# Patient Record
Sex: Male | Born: 1950 | Race: White | Hispanic: No | Marital: Married | Smoking: Never smoker
Health system: Southern US, Community
[De-identification: ages and names within clinical notes are randomized; demographics above are authoritative.]

## PROBLEM LIST (undated history)

## (undated) DIAGNOSIS — K219 Gastro-esophageal reflux disease without esophagitis: Secondary | ICD-10-CM

## (undated) DIAGNOSIS — I10 Essential (primary) hypertension: Secondary | ICD-10-CM

## (undated) DIAGNOSIS — F419 Anxiety disorder, unspecified: Secondary | ICD-10-CM

## (undated) DIAGNOSIS — E119 Type 2 diabetes mellitus without complications: Secondary | ICD-10-CM

## (undated) DIAGNOSIS — N3281 Overactive bladder: Secondary | ICD-10-CM

## (undated) DIAGNOSIS — F32A Depression, unspecified: Secondary | ICD-10-CM

## (undated) DIAGNOSIS — E785 Hyperlipidemia, unspecified: Secondary | ICD-10-CM

## (undated) DIAGNOSIS — M792 Neuralgia and neuritis, unspecified: Secondary | ICD-10-CM

## (undated) DIAGNOSIS — F329 Major depressive disorder, single episode, unspecified: Secondary | ICD-10-CM

## (undated) DIAGNOSIS — D649 Anemia, unspecified: Secondary | ICD-10-CM

## (undated) DIAGNOSIS — Z944 Liver transplant status: Secondary | ICD-10-CM

## (undated) DIAGNOSIS — M1990 Unspecified osteoarthritis, unspecified site: Secondary | ICD-10-CM

## (undated) HISTORY — DX: Anemia, unspecified: D64.9

## (undated) HISTORY — PX: TONSILLECTOMY: SUR1361

## (undated) HISTORY — DX: Unspecified osteoarthritis, unspecified site: M19.90

## (undated) HISTORY — DX: Hyperlipidemia, unspecified: E78.5

## (undated) HISTORY — PX: LIVER TRANSPLANT: SHX410

## (undated) HISTORY — PX: CHOLECYSTECTOMY: SHX55

---

## 1980-07-06 DIAGNOSIS — Z8619 Personal history of other infectious and parasitic diseases: Secondary | ICD-10-CM

## 1980-07-06 HISTORY — DX: Personal history of other infectious and parasitic diseases: Z86.19

## 1995-07-07 HISTORY — PX: TOTAL HIP ARTHROPLASTY: SHX124

## 2004-07-06 DIAGNOSIS — E119 Type 2 diabetes mellitus without complications: Secondary | ICD-10-CM

## 2004-07-06 HISTORY — DX: Type 2 diabetes mellitus without complications: E11.9

## 2018-02-27 ENCOUNTER — Encounter (HOSPITAL_COMMUNITY): Payer: Self-pay | Admitting: *Deleted

## 2018-02-27 ENCOUNTER — Emergency Department (HOSPITAL_COMMUNITY): Payer: Medicare HMO

## 2018-02-27 ENCOUNTER — Other Ambulatory Visit: Payer: Self-pay

## 2018-02-27 ENCOUNTER — Emergency Department (HOSPITAL_COMMUNITY)
Admission: EM | Admit: 2018-02-27 | Discharge: 2018-02-28 | Disposition: A | Discharge status: Home / Self Care | Payer: Medicare HMO | Source: Home / Self Care | Attending: Emergency Medicine | Admitting: Emergency Medicine

## 2018-02-27 DIAGNOSIS — I10 Essential (primary) hypertension: Secondary | ICD-10-CM

## 2018-02-27 DIAGNOSIS — Z7982 Long term (current) use of aspirin: Secondary | ICD-10-CM | POA: Diagnosis not present

## 2018-02-27 DIAGNOSIS — Z944 Liver transplant status: Secondary | ICD-10-CM | POA: Insufficient documentation

## 2018-02-27 DIAGNOSIS — Z794 Long term (current) use of insulin: Secondary | ICD-10-CM | POA: Insufficient documentation

## 2018-02-27 DIAGNOSIS — E785 Hyperlipidemia, unspecified: Secondary | ICD-10-CM | POA: Diagnosis not present

## 2018-02-27 DIAGNOSIS — N3281 Overactive bladder: Secondary | ICD-10-CM | POA: Diagnosis not present

## 2018-02-27 DIAGNOSIS — A419 Sepsis, unspecified organism: Secondary | ICD-10-CM | POA: Diagnosis not present

## 2018-02-27 DIAGNOSIS — M7989 Other specified soft tissue disorders: Secondary | ICD-10-CM | POA: Diagnosis not present

## 2018-02-27 DIAGNOSIS — E119 Type 2 diabetes mellitus without complications: Secondary | ICD-10-CM | POA: Diagnosis not present

## 2018-02-27 DIAGNOSIS — F329 Major depressive disorder, single episode, unspecified: Secondary | ICD-10-CM | POA: Diagnosis not present

## 2018-02-27 DIAGNOSIS — N390 Urinary tract infection, site not specified: Secondary | ICD-10-CM | POA: Insufficient documentation

## 2018-02-27 DIAGNOSIS — F419 Anxiety disorder, unspecified: Secondary | ICD-10-CM | POA: Diagnosis not present

## 2018-02-27 DIAGNOSIS — Z96649 Presence of unspecified artificial hip joint: Secondary | ICD-10-CM

## 2018-02-27 DIAGNOSIS — R509 Fever, unspecified: Secondary | ICD-10-CM | POA: Diagnosis present

## 2018-02-27 DIAGNOSIS — K219 Gastro-esophageal reflux disease without esophagitis: Secondary | ICD-10-CM | POA: Diagnosis not present

## 2018-02-27 DIAGNOSIS — D696 Thrombocytopenia, unspecified: Secondary | ICD-10-CM | POA: Diagnosis not present

## 2018-02-27 DIAGNOSIS — R600 Localized edema: Secondary | ICD-10-CM | POA: Diagnosis not present

## 2018-02-27 DIAGNOSIS — R2243 Localized swelling, mass and lump, lower limb, bilateral: Secondary | ICD-10-CM | POA: Insufficient documentation

## 2018-02-27 DIAGNOSIS — G9341 Metabolic encephalopathy: Secondary | ICD-10-CM | POA: Diagnosis not present

## 2018-02-27 DIAGNOSIS — M79675 Pain in left toe(s): Secondary | ICD-10-CM

## 2018-02-27 DIAGNOSIS — R51 Headache: Secondary | ICD-10-CM

## 2018-02-27 HISTORY — DX: Type 2 diabetes mellitus without complications: E11.9

## 2018-02-27 HISTORY — DX: Liver transplant status: Z94.4

## 2018-02-27 HISTORY — DX: Anxiety disorder, unspecified: F41.9

## 2018-02-27 HISTORY — DX: Neuralgia and neuritis, unspecified: M79.2

## 2018-02-27 HISTORY — DX: Major depressive disorder, single episode, unspecified: F32.9

## 2018-02-27 HISTORY — DX: Overactive bladder: N32.81

## 2018-02-27 HISTORY — DX: Gastro-esophageal reflux disease without esophagitis: K21.9

## 2018-02-27 HISTORY — DX: Depression, unspecified: F32.A

## 2018-02-27 HISTORY — DX: Essential (primary) hypertension: I10

## 2018-02-27 LAB — CBC WITH DIFFERENTIAL/PLATELET
BASOS ABS: 0 10*3/uL (ref 0.0–0.1)
BASOS PCT: 0 %
Eosinophils Absolute: 0 10*3/uL (ref 0.0–0.7)
Eosinophils Relative: 0 %
HEMATOCRIT: 36.4 % — AB (ref 39.0–52.0)
HEMOGLOBIN: 12.1 g/dL — AB (ref 13.0–17.0)
Lymphocytes Relative: 10 %
Lymphs Abs: 0.8 10*3/uL (ref 0.7–4.0)
MCH: 29.3 pg (ref 26.0–34.0)
MCHC: 33.2 g/dL (ref 30.0–36.0)
MCV: 88.1 fL (ref 78.0–100.0)
Monocytes Absolute: 0.6 10*3/uL (ref 0.1–1.0)
Monocytes Relative: 7 %
NEUTROS ABS: 6.5 10*3/uL (ref 1.7–7.7)
NEUTROS PCT: 83 %
Platelets: 79 10*3/uL — ABNORMAL LOW (ref 150–400)
RBC: 4.13 MIL/uL — AB (ref 4.22–5.81)
RDW: 14 % (ref 11.5–15.5)
WBC: 7.9 10*3/uL (ref 4.0–10.5)

## 2018-02-27 LAB — URINALYSIS, ROUTINE W REFLEX MICROSCOPIC
BACTERIA UA: NONE SEEN
Bilirubin Urine: NEGATIVE
Glucose, UA: NEGATIVE mg/dL
KETONES UR: NEGATIVE mg/dL
Nitrite: NEGATIVE
Protein, ur: NEGATIVE mg/dL
Specific Gravity, Urine: 1.013 (ref 1.005–1.030)
pH: 5 (ref 5.0–8.0)

## 2018-02-27 LAB — I-STAT CG4 LACTIC ACID, ED: Lactic Acid, Venous: 0.62 mmol/L (ref 0.5–1.9)

## 2018-02-27 LAB — COMPREHENSIVE METABOLIC PANEL
ALBUMIN: 3.8 g/dL (ref 3.5–5.0)
ALK PHOS: 68 U/L (ref 38–126)
ALT: 33 U/L (ref 0–44)
ANION GAP: 9 (ref 5–15)
AST: 28 U/L (ref 15–41)
BUN: 24 mg/dL — AB (ref 8–23)
CO2: 24 mmol/L (ref 22–32)
CREATININE: 1.39 mg/dL — AB (ref 0.61–1.24)
Calcium: 9.1 mg/dL (ref 8.9–10.3)
Chloride: 108 mmol/L (ref 98–111)
GFR calc Af Amer: >60 mL/min (ref >60)
GFR calc non Af Amer: 52 mL/min — ABNORMAL LOW (ref >60)
GLUCOSE: 137 mg/dL — AB (ref 70–99)
Potassium: 4.2 mmol/L (ref 3.5–5.1)
SODIUM: 141 mmol/L (ref 135–145)
Total Bilirubin: 0.7 mg/dL (ref 0.3–1.2)
Total Protein: 6.4 g/dL — ABNORMAL LOW (ref 6.5–8.1)

## 2018-02-27 MED ORDER — CEPHALEXIN 500 MG PO CAPS: 500.0000 mg | ORAL_CAPSULE | Freq: Four times a day (QID) | ORAL | 0 refills | Status: AC

## 2018-02-27 MED ORDER — SODIUM CHLORIDE 0.9 % IV SOLN
1.0000 g | Freq: Once | INTRAVENOUS | Status: AC
  Administered 2018-02-27: 1 g via INTRAVENOUS
  Filled 2018-02-27: qty 10

## 2018-02-27 MED ORDER — HYDROMORPHONE HCL 1 MG/ML IJ SOLN
0.5000 mg | Freq: Once | INTRAMUSCULAR | Status: AC
  Administered 2018-02-27: 0.5 mg via INTRAVENOUS
  Filled 2018-02-27: qty 1

## 2018-02-27 MED ORDER — ACETAMINOPHEN 325 MG PO TABS
650.0000 mg | ORAL_TABLET | Freq: Once | ORAL | Status: AC
  Administered 2018-02-27: 650 mg via ORAL
  Filled 2018-02-27: qty 2

## 2018-02-27 MED ORDER — INSULIN GLARGINE 100 UNIT/ML ~~LOC~~ SOLN: 40.0000 [IU] | Freq: Every day | SUBCUTANEOUS | 11 refills | Status: AC

## 2018-02-27 MED ORDER — INSULIN GLARGINE 100 UNIT/ML ~~LOC~~ SOLN
40.0000 [IU] | Freq: Once | SUBCUTANEOUS | Status: AC
  Administered 2018-02-28: 40 [IU] via SUBCUTANEOUS
  Filled 2018-02-27: qty 0.4

## 2018-02-27 MED ORDER — SODIUM CHLORIDE 0.9 % IV BOLUS
500.0000 mL | Freq: Once | INTRAVENOUS | Status: AC
  Administered 2018-02-27: 500 mL via INTRAVENOUS

## 2018-02-27 NOTE — ED Notes (Signed)
Bed: WA01 Expected date:  Expected time:  Means of arrival:  Comments: 67 yo M/poss sepsis

## 2018-02-27 NOTE — ED Triage Notes (Signed)
Pt bib EMS and coming from home.  Pt has been feeling like he's been having a fever x 1 week.  Family stated to EMS that pt is a little altered in his mental status.  Pt is unsure of the year and is slow to respond.  Pt feels warm to the touch and has swelling to bilateral lower legs.  Pt his tachypnec and tachycardiac. Pt recently moved from [place] and hasn't had all of his medication reconciled.  Hx of liver transplant x 10 years.  Pt has been able to get and take his transplant medications.  Pt think he has a UTI from self-catherizing.  EMS VS:  CBG: 153, Temp: 105.1, HR: 110, BP: 132/84, RR: 30

## 2018-02-27 NOTE — ED Provider Notes (Signed)
H/o liver transplant x 10 years New to [place] - not established Fever x 1 week, Tmax 100.7, abdominal pain and left flank pain H/o self caths - UA no bacteria Labs pending HA since arrival LE edema, weeks to months, left > right In process of getting meds here from [place] - out for one week  Tylenol for HA given Has been seen by Wilson Singer  Patient does not want to be admitted. Plan is IV dilaudid, recheck headache for improvement. If better, can discharge per plan of Khatri/Kohut.  On recheck, the patient reports his headache is resolved and he is ready for discharge home per plan of previous treatment team.    Charlann Lange, PA-C 02/28/18 2458    Virgel Manifold, MD 03/03/18 1445

## 2018-02-27 NOTE — Discharge Instructions (Addendum)
Please complete the entire course of your antibiotics regardless of symptom improvement to prevent worsening or recurrence of your infection. You will need to return in the morning to complete an ultrasound of your left leg to rule out a DVT. Take Lantus as was previously prescribed. Return to ED for worsening symptoms, worsening headache or abdominal pain, ongoing fever not responding to medications, vomiting or coughing up blood.

## 2018-02-27 NOTE — ED Provider Notes (Signed)
[facility] DEPT Provider Note   CSN: [id number] Arrival date & time: 02/27/18  2023     History   Chief Complaint Chief Complaint  Patient presents with  . Fever    HPI Jonathan Perkins is a 67 y.o. male with a past medical history of HTN, diabetes, s/p liver transplant x2 due to hepatitis C (10 years ago at [facility]), who presents to ED for evaluation of multiple complaints. Patient moved to the area last month and has not yet established care here. He complains of fever for the past week. He self-caths three times daily as directed by his urologist in [place]. He believes he has a UTI from his self cathing.  He reports left-sided flank pain and discomfort.  He reports T-max of 100 point 31F today.  He lacks took an antipyretic anywhere from 6 to 7 hours ago.  He denies any bowel changes, back pain, penile discharge. His next complaint is lower extremity edema.  He states that the symptoms have been going on for several months.  He was seen and evaluated by his PCP but stated that "they are concerned about it and did not do anything about it."  He reports left-sided great toe pain for the past several days that is improved.  Denies any history of gout.  Denies any injury or trauma to the area.  He denies history of DVT, recent surgeries, recent prolonged travel. His next complaint is headache.  States he has had a headache since arriving in the ED.  Denies any neck pain, head injuries or falls, numbness in arms or legs. Patient has not been able to get his medications physically refilled here in [place] from [place].  He last took 13 units of insulin last night.  He has not taken any insulin today.  HPI  Past Medical History:  Diagnosis Date  . Acid reflux   . Anxiety   . Depression   . Diabetes mellitus without complication ([place])   . Hypertension   . Liver transplant recipient [place])   . Nerve pain   . Overactive bladder     There are no active  problems to display for this patient.   Past Surgical History:  Procedure Laterality Date  . CHOLECYSTECTOMY    . LIVER TRANSPLANT     2006, 2009  . TONSILLECTOMY    . TOTAL HIP ARTHROPLASTY  1997        Home Medications    Prior to Admission medications   Medication Sig Start Date End Date Taking? Authorizing Provider  cephALEXin (KEFLEX) 500 MG capsule Take 1 capsule (500 mg total) by mouth 4 (four) times daily for 7 days. 02/27/18 03/06/18  Yamel Bale, PA-C  insulin glargine (LANTUS) 100 UNIT/ML injection Inject 0.4 mLs (40 Units total) into the skin at bedtime. 02/27/18   Delia Heady, PA-C    Family History No family history on file.  Social History Social History   Tobacco Use  . Smoking status: Never Smoker  . Smokeless tobacco: Never Used  Substance Use Topics  . Alcohol use: Never    Frequency: Never  . Drug use: Never     Allergies   Patient has no known allergies.   Review of Systems Review of Systems  Constitutional: Positive for chills and fever. Negative for appetite change.  HENT: Negative for ear pain, rhinorrhea, sneezing and sore throat.   Eyes: Negative for photophobia and visual disturbance.  Respiratory: Negative for cough, chest tightness, shortness of  breath and wheezing.   Cardiovascular: Positive for leg swelling. Negative for chest pain and palpitations.  Gastrointestinal: Positive for abdominal pain. Negative for blood in stool, constipation, diarrhea, nausea and vomiting.  Genitourinary: Positive for dysuria and flank pain. Negative for hematuria and urgency.  Musculoskeletal: Negative for myalgias.  Skin: Negative for rash.  Allergic/Immunologic: Positive for immunocompromised state.  Neurological: Positive for headaches. Negative for dizziness, weakness and light-headedness.     Physical Exam Updated Vital Signs BP 126/88 (BP Location: Right Arm)   Pulse (!) 116   Temp 99.4 F (37.4 C) (Oral)   Resp (!) 21   Ht 6\' 2"  (1.88  m)   Wt 120.2 kg   SpO2 97%   BMI 34.02 kg/m   Physical Exam  Constitutional: He is oriented to person, place, and time. He appears well-developed and well-nourished. No distress.  Nontoxic appearing and in no acute distress.  HENT:  Head: Normocephalic and atraumatic.  Nose: Nose normal.  Eyes: Pupils are equal, round, and reactive to light. Conjunctivae and EOM are normal. Right eye exhibits no discharge. Left eye exhibits no discharge. No scleral icterus.  Neck: Normal range of motion. Neck supple.  No meningismus.  Cardiovascular: Regular rhythm, normal heart sounds and intact distal pulses. Tachycardia present. Exam reveals no gallop and no friction rub.  No murmur heard. Pulmonary/Chest: Effort normal and breath sounds normal. No respiratory distress.  Abdominal: Soft. Bowel sounds are normal. He exhibits no distension. There is tenderness (Generalized). There is no guarding.  No CVA tenderness bilaterally.  Musculoskeletal: Normal range of motion. He exhibits no edema.  Trace pitting edema in bilateral lower extremities.  Dorsum of left foot appears edematous. L > R.  No calf tenderness or overlying erythema bilaterally.  Mild erythema of the left great toe with no tenderness to palpation.  Neurological: He is alert and oriented to person, place, and time. No cranial nerve deficit or sensory deficit. He exhibits normal muscle tone. Coordination normal.  Pupils reactive. No facial asymmetry noted. Cranial nerves appear grossly intact. Sensation intact to light touch on face, BUE and BLE. Strength 5/5 in BUE and BLE.   Skin: Skin is warm and dry. No rash noted.  Psychiatric: He has a normal mood and affect.  Nursing note and vitals reviewed.    ED Treatments / Results  Labs (all labs ordered are listed, but only abnormal results are displayed) Labs Reviewed  URINALYSIS, ROUTINE W REFLEX MICROSCOPIC - Abnormal; Notable for the following components:      Result Value   Hgb  urine dipstick MODERATE (*)    Leukocytes, UA TRACE (*)    All other components within normal limits  CBC WITH DIFFERENTIAL/PLATELET - Abnormal; Notable for the following components:   RBC 4.13 (*)    Hemoglobin 12.1 (*)    HCT 36.4 (*)    Platelets 79 (*)    All other components within normal limits  COMPREHENSIVE METABOLIC PANEL - Abnormal; Notable for the following components:   Glucose, Bld 137 (*)    BUN 24 (*)    Creatinine, Ser 1.39 (*)    Total Protein 6.4 (*)    GFR calc non Af Amer 52 (*)    All other components within normal limits  URINE CULTURE  CULTURE, BLOOD (ROUTINE X 2)  CULTURE, BLOOD (ROUTINE X 2)  I-STAT CG4 LACTIC ACID, ED    EKG None  Radiology Dg Chest 2 View  Result Date: 02/27/2018 CLINICAL DATA:  Fever EXAM:  CHEST - 2 VIEW COMPARISON:  None. FINDINGS: Right base atelectasis. Left lung clear. Heart is normal size. No effusions or acute bony abnormality. IMPRESSION: Right base atelectasis. Electronically Signed   By: Rolm Baptise M.D.   On: 02/27/2018 23:01    Procedures Procedures (including critical care time)  Medications Ordered in ED Medications  sodium chloride 0.9 % bolus 500 mL (500 mLs Intravenous New Bag/Given 02/27/18 2253)  HYDROmorphone (DILAUDID) injection 0.5 mg (has no administration in time range)  insulin glargine (LANTUS) injection 40 Units (has no administration in time range)  cefTRIAXone (ROCEPHIN) 1 g in sodium chloride 0.9 % 100 mL IVPB (0 g Intravenous Stopped 02/27/18 2243)  acetaminophen (TYLENOL) tablet 650 mg (650 mg Oral Given 02/27/18 2217)     Initial Impression / Assessment and Plan / ED Course  I have reviewed the triage vital signs and the nursing notes.  Pertinent labs & imaging results that were available during my care of the patient were reviewed by me and considered in my medical decision making (see chart for details).  Clinical Course as of Feb 28 2335  Nancy Fetter Feb 27, 2018  2223 Additional history obtained  from patient.  He has been self cathing 3 times a day for the past month.  He was told by his urologist that he had a "overactive bladder."  He received Botox injections 4 weeks ago but states that "they gave me too much Botox my bladder was too relaxed."  Since then he has had to self cath.   [HK]  2315 Patient reports he has been taking his HTN and other medications. He just ran out of his Lantus last night.   [HK]    Clinical Course User Index [HK] Delia Heady, PA-C    67 year old male with a past medical history of hypertension, diabetes, status post liver transplant x2 (10 years ago at [facility]) who recently moved to this area and has not yet established with a primary care provider here presents for multiple complaints. 1) fever and possible UTI: Patient reports that he needs to self cath his bladder 3 times a day due to his overactive bladder.  He reports T-max of 100.7.  Last took antipyretics 6 hours ago.  Denies any bowel changes.  He reports left-sided flank pain and discomfort.  Tenderness palpation of the general abdomen and left flank noted on exam.  No rebound or guarding noted.  Febrile to 100.5 on arrival here.  Urine sent for culture but did show trace leukocytes, some WBCs but no bacteria.  Patient given Rocephin. CXR with no acute abnormalities. Lactic acid normal at 0.62. CBC with normal WBC count. CMP shows slight elevation in Cr to 1.3, Bun to 24.  No prior labs for comparison. 2) lower extremity edema L > R: States this is going on for several weeks to months.  He reports his PCP has evaluated him about the matter but is not concerned about it.  He has never been evaluated for a DVT.  Denies history of DVT, recent surgeries, recent prolonged travel. Trace pitting edema noted on exam bilaterally. Edema of the dorsum of the left foot noted. Findings suspicious for gout noted on left great toe although he reports pain has improved over the past several days. He will need an U/S to r/o  DVT.  3) headache: appears to be a tension headache. He reports since arrival in the ED he's had the headache. No deficits in neurological exam noted. PERRL. No meningismus noted.  Most likely 2/2 fever.  Patient given Rocephin here.  Fever improved.  Will give pain medication for headache and reassess.  Patient is requesting discharge home which I feel is reasonable based on his unremarkable lab work and reassuring vital signs now.  He remains tachycardic.  Will discharge home with ultrasound to rule out DVT of left leg in the a.m., remainder of antibiotic course and refill of insulin.  Patient given 40 units of insulin which is his nightly dose here prior to discharge. Care handed off to S. Upstill, PA-C pending recheck after medication administered.  Patient discussed with and seen by my attending, Dr. Wilson Singer.  Final Clinical Impressions(s) / ED Diagnoses   Final diagnoses:  Lower urinary tract infectious disease    ED Discharge Orders         Ordered    LE VENOUS     02/27/18 2335    cephALEXin (KEFLEX) 500 MG capsule  4 times daily     02/27/18 2335    insulin glargine (LANTUS) 100 UNIT/ML injection  Daily at bedtime     02/27/18 2335           Delia Heady, PA-C 02/27/18 2339    Virgel Manifold, MD 03/03/18 1445

## 2018-02-27 NOTE — ED Notes (Signed)
Dr. Kohut at bedside 

## 2018-02-28 ENCOUNTER — Emergency Department (HOSPITAL_COMMUNITY): Admit: 2018-02-28 | Payer: Medicare HMO

## 2018-02-28 ENCOUNTER — Observation Stay (HOSPITAL_COMMUNITY): Payer: Medicare HMO

## 2018-02-28 ENCOUNTER — Emergency Department (HOSPITAL_BASED_OUTPATIENT_CLINIC_OR_DEPARTMENT_OTHER): Payer: Medicare HMO

## 2018-02-28 ENCOUNTER — Other Ambulatory Visit: Payer: Self-pay

## 2018-02-28 ENCOUNTER — Observation Stay (HOSPITAL_COMMUNITY)
Admission: EM | Admit: 2018-02-28 | Discharge: 2018-03-01 | Disposition: A | Discharge status: Other Acute Inpatient Hospital | Payer: Medicare HMO | Attending: Internal Medicine | Admitting: Internal Medicine

## 2018-02-28 ENCOUNTER — Emergency Department (HOSPITAL_COMMUNITY): Payer: Medicare HMO

## 2018-02-28 ENCOUNTER — Encounter (HOSPITAL_COMMUNITY): Payer: Self-pay | Admitting: Internal Medicine

## 2018-02-28 DIAGNOSIS — R509 Fever, unspecified: Secondary | ICD-10-CM

## 2018-02-28 DIAGNOSIS — R609 Edema, unspecified: Secondary | ICD-10-CM | POA: Diagnosis not present

## 2018-02-28 DIAGNOSIS — F419 Anxiety disorder, unspecified: Secondary | ICD-10-CM | POA: Insufficient documentation

## 2018-02-28 DIAGNOSIS — D696 Thrombocytopenia, unspecified: Secondary | ICD-10-CM | POA: Diagnosis present

## 2018-02-28 DIAGNOSIS — N3281 Overactive bladder: Secondary | ICD-10-CM | POA: Insufficient documentation

## 2018-02-28 DIAGNOSIS — E119 Type 2 diabetes mellitus without complications: Secondary | ICD-10-CM | POA: Diagnosis not present

## 2018-02-28 DIAGNOSIS — N3000 Acute cystitis without hematuria: Secondary | ICD-10-CM

## 2018-02-28 DIAGNOSIS — A419 Sepsis, unspecified organism: Secondary | ICD-10-CM | POA: Diagnosis not present

## 2018-02-28 DIAGNOSIS — G9341 Metabolic encephalopathy: Secondary | ICD-10-CM | POA: Diagnosis not present

## 2018-02-28 DIAGNOSIS — Z944 Liver transplant status: Secondary | ICD-10-CM

## 2018-02-28 DIAGNOSIS — N390 Urinary tract infection, site not specified: Secondary | ICD-10-CM | POA: Diagnosis present

## 2018-02-28 DIAGNOSIS — R600 Localized edema: Secondary | ICD-10-CM | POA: Insufficient documentation

## 2018-02-28 DIAGNOSIS — Z794 Long term (current) use of insulin: Secondary | ICD-10-CM | POA: Insufficient documentation

## 2018-02-28 DIAGNOSIS — M7989 Other specified soft tissue disorders: Secondary | ICD-10-CM | POA: Diagnosis present

## 2018-02-28 DIAGNOSIS — Z7982 Long term (current) use of aspirin: Secondary | ICD-10-CM | POA: Insufficient documentation

## 2018-02-28 DIAGNOSIS — E785 Hyperlipidemia, unspecified: Secondary | ICD-10-CM | POA: Insufficient documentation

## 2018-02-28 DIAGNOSIS — F329 Major depressive disorder, single episode, unspecified: Secondary | ICD-10-CM | POA: Insufficient documentation

## 2018-02-28 DIAGNOSIS — I10 Essential (primary) hypertension: Secondary | ICD-10-CM

## 2018-02-28 DIAGNOSIS — K219 Gastro-esophageal reflux disease without esophagitis: Secondary | ICD-10-CM

## 2018-02-28 LAB — COMPREHENSIVE METABOLIC PANEL
ALBUMIN: 3.8 g/dL (ref 3.5–5.0)
ALK PHOS: 63 U/L (ref 38–126)
ALT: 37 U/L (ref 0–44)
ANION GAP: 11 (ref 5–15)
AST: 27 U/L (ref 15–41)
BILIRUBIN TOTAL: 0.7 mg/dL (ref 0.3–1.2)
BUN: 19 mg/dL (ref 8–23)
CALCIUM: 9.2 mg/dL (ref 8.9–10.3)
CO2: 22 mmol/L (ref 22–32)
Chloride: 106 mmol/L (ref 98–111)
Creatinine, Ser: 1.21 mg/dL (ref 0.61–1.24)
GFR calc Af Amer: >60 mL/min (ref >60)
GLUCOSE: 155 mg/dL — AB (ref 70–99)
POTASSIUM: 4.1 mmol/L (ref 3.5–5.1)
Sodium: 139 mmol/L (ref 135–145)
TOTAL PROTEIN: 6.7 g/dL (ref 6.5–8.1)

## 2018-02-28 LAB — CBG MONITORING, ED
GLUCOSE-CAPILLARY: 114 mg/dL — AB (ref 70–99)
Glucose-Capillary: 127 mg/dL — ABNORMAL HIGH (ref 70–99)

## 2018-02-28 LAB — PROTIME-INR
INR: 1.11
Prothrombin Time: 14.2 seconds (ref 11.4–15.2)

## 2018-02-28 LAB — AMMONIA
Ammonia: 20 umol/L (ref 9–35)
Ammonia: 27 umol/L (ref 9–35)

## 2018-02-28 LAB — CBC WITH DIFFERENTIAL/PLATELET
BASOS ABS: 0 10*3/uL (ref 0.0–0.1)
BASOS PCT: 0 %
EOS PCT: 0 %
Eosinophils Absolute: 0 10*3/uL (ref 0.0–0.7)
HEMATOCRIT: 37.3 % — AB (ref 39.0–52.0)
Hemoglobin: 12.3 g/dL — ABNORMAL LOW (ref 13.0–17.0)
Lymphocytes Relative: 8 %
Lymphs Abs: 0.8 10*3/uL (ref 0.7–4.0)
MCH: 29.2 pg (ref 26.0–34.0)
MCHC: 33 g/dL (ref 30.0–36.0)
MCV: 88.6 fL (ref 78.0–100.0)
MONO ABS: 0.7 10*3/uL (ref 0.1–1.0)
Monocytes Relative: 7 %
NEUTROS ABS: 8.6 10*3/uL — AB (ref 1.7–7.7)
Neutrophils Relative %: 85 %
PLATELETS: 76 10*3/uL — AB (ref 150–400)
RBC: 4.21 MIL/uL — AB (ref 4.22–5.81)
RDW: 14.3 % (ref 11.5–15.5)
WBC: 10 10*3/uL (ref 4.0–10.5)

## 2018-02-28 LAB — URINALYSIS, COMPLETE (UACMP) WITH MICROSCOPIC
BILIRUBIN URINE: NEGATIVE
GLUCOSE, UA: NEGATIVE mg/dL
NITRITE: NEGATIVE
SPECIFIC GRAVITY, URINE: 1.02 (ref 1.005–1.030)
pH: 6 (ref 5.0–8.0)

## 2018-02-28 LAB — BRAIN NATRIURETIC PEPTIDE: B NATRIURETIC PEPTIDE 5: 74.3 pg/mL (ref 0.0–100.0)

## 2018-02-28 LAB — LACTATE DEHYDROGENASE: LDH: 118 U/L (ref 98–192)

## 2018-02-28 LAB — LACTIC ACID, PLASMA: Lactic Acid, Venous: 1.4 mmol/L (ref 0.5–1.9)

## 2018-02-28 LAB — PROCALCITONIN: Procalcitonin: 0.25 ng/mL

## 2018-02-28 MED ORDER — HYDROMORPHONE HCL 1 MG/ML IJ SOLN
1.0000 mg | Freq: Once | INTRAMUSCULAR | Status: AC
  Administered 2018-02-28: 1 mg via INTRAVENOUS
  Filled 2018-02-28: qty 1

## 2018-02-28 MED ORDER — HYDROMORPHONE HCL 1 MG/ML IJ SOLN
1.0000 mg | INTRAMUSCULAR | Status: DC | PRN
  Administered 2018-02-28 (×2): 1 mg via INTRAVENOUS
  Filled 2018-02-28 (×2): qty 1

## 2018-02-28 MED ORDER — PREGABALIN 50 MG PO CAPS
50.0000 mg | ORAL_CAPSULE | Freq: Two times a day (BID) | ORAL | Status: DC
  Administered 2018-02-28: 50 mg via ORAL
  Filled 2018-02-28: qty 1

## 2018-02-28 MED ORDER — ONDANSETRON HCL 4 MG/2ML IJ SOLN
4.0000 mg | Freq: Three times a day (TID) | INTRAMUSCULAR | Status: DC | PRN
  Administered 2018-02-28: 4 mg via INTRAVENOUS
  Filled 2018-02-28: qty 2

## 2018-02-28 MED ORDER — ACETAMINOPHEN 325 MG PO TABS
650.0000 mg | ORAL_TABLET | Freq: Four times a day (QID) | ORAL | Status: DC | PRN
  Administered 2018-03-01: 650 mg via ORAL
  Filled 2018-02-28: qty 2

## 2018-02-28 MED ORDER — ONDANSETRON HCL 4 MG/2ML IJ SOLN
4.0000 mg | Freq: Once | INTRAMUSCULAR | Status: AC
  Administered 2018-02-28: 4 mg via INTRAVENOUS
  Filled 2018-02-28: qty 2

## 2018-02-28 MED ORDER — INSULIN ASPART 100 UNIT/ML ~~LOC~~ SOLN: 0.0000 [IU] | Freq: Three times a day (TID) | SUBCUTANEOUS | Status: DC

## 2018-02-28 MED ORDER — SODIUM CHLORIDE 0.9 % IV BOLUS
1000.0000 mL | Freq: Once | INTRAVENOUS | Status: AC
  Administered 2018-02-28: 1000 mL via INTRAVENOUS

## 2018-02-28 MED ORDER — CLONIDINE HCL 0.1 MG PO TABS: 0.2000 mg | ORAL_TABLET | Freq: Two times a day (BID) | ORAL | Status: DC

## 2018-02-28 MED ORDER — SODIUM CHLORIDE 0.9 % IV SOLN
1.0000 g | Freq: Three times a day (TID) | INTRAVENOUS | Status: DC
  Filled 2018-02-28: qty 1

## 2018-02-28 MED ORDER — VANCOMYCIN HCL 10 G IV SOLR: 1750.0000 mg | INTRAVENOUS | Status: DC

## 2018-02-28 MED ORDER — SODIUM CHLORIDE 0.9 % IV SOLN
INTRAVENOUS | Status: DC
  Administered 2018-02-28: 23:00:00 via INTRAVENOUS

## 2018-02-28 MED ORDER — CLONIDINE HCL 0.1 MG PO TABS
0.1000 mg | ORAL_TABLET | Freq: Two times a day (BID) | ORAL | Status: DC
  Administered 2018-02-28: 0.1 mg via ORAL
  Filled 2018-02-28: qty 1

## 2018-02-28 MED ORDER — PANTOPRAZOLE SODIUM 40 MG PO TBEC: 40.0000 mg | DELAYED_RELEASE_TABLET | Freq: Every day | ORAL | Status: DC

## 2018-02-28 MED ORDER — PRAVASTATIN SODIUM 10 MG PO TABS
10.0000 mg | ORAL_TABLET | Freq: Every day | ORAL | Status: DC
  Administered 2018-02-28: 10 mg via ORAL
  Filled 2018-02-28: qty 1

## 2018-02-28 MED ORDER — TAMSULOSIN HCL 0.4 MG PO CAPS
0.4000 mg | ORAL_CAPSULE | Freq: Every day | ORAL | Status: DC
  Administered 2018-02-28: 0.4 mg via ORAL
  Filled 2018-02-28: qty 1

## 2018-02-28 MED ORDER — SENNOSIDES-DOCUSATE SODIUM 8.6-50 MG PO TABS: 1.0000 | ORAL_TABLET | Freq: Every evening | ORAL | Status: DC | PRN

## 2018-02-28 MED ORDER — TRAMADOL HCL 50 MG PO TABS: 50.0000 mg | ORAL_TABLET | Freq: Four times a day (QID) | ORAL | Status: DC | PRN

## 2018-02-28 MED ORDER — PROCHLORPERAZINE EDISYLATE 10 MG/2ML IJ SOLN
10.0000 mg | Freq: Once | INTRAMUSCULAR | Status: AC
  Administered 2018-02-28: 10 mg via INTRAVENOUS
  Filled 2018-02-28: qty 2

## 2018-02-28 MED ORDER — ZOLPIDEM TARTRATE 5 MG PO TABS: 5.0000 mg | ORAL_TABLET | Freq: Every evening | ORAL | Status: DC | PRN

## 2018-02-28 MED ORDER — ACETAMINOPHEN 650 MG RE SUPP: 650.0000 mg | Freq: Four times a day (QID) | RECTAL | Status: DC | PRN

## 2018-02-28 MED ORDER — DESONIDE 0.05 % EX OINT
1.0000 "application " | TOPICAL_OINTMENT | Freq: Two times a day (BID) | CUTANEOUS | Status: DC
  Filled 2018-02-28: qty 15

## 2018-02-28 MED ORDER — METRONIDAZOLE 0.75 % EX GEL
1.0000 "application " | Freq: Every day | CUTANEOUS | Status: DC | PRN
  Filled 2018-02-28: qty 45

## 2018-02-28 MED ORDER — VANCOMYCIN HCL 10 G IV SOLR
2500.0000 mg | INTRAVENOUS | Status: AC
  Administered 2018-02-28: 2500 mg via INTRAVENOUS
  Filled 2018-02-28: qty 2000

## 2018-02-28 MED ORDER — TRIAMCINOLONE ACETONIDE 0.1 % EX CREA
1.0000 "application " | TOPICAL_CREAM | Freq: Two times a day (BID) | CUTANEOUS | Status: DC
  Filled 2018-02-28: qty 15

## 2018-02-28 MED ORDER — CYCLOSPORINE MODIFIED (GENGRAF) 100 MG PO CAPS
100.0000 mg | ORAL_CAPSULE | Freq: Two times a day (BID) | ORAL | Status: DC
  Administered 2018-02-28: 100 mg via ORAL
  Filled 2018-02-28: qty 1

## 2018-02-28 MED ORDER — HYDRALAZINE HCL 20 MG/ML IJ SOLN: 5.0000 mg | INTRAMUSCULAR | Status: DC | PRN

## 2018-02-28 MED ORDER — SODIUM CHLORIDE 0.9 % IV SOLN
2.0000 g | INTRAVENOUS | Status: AC
  Administered 2018-02-28: 2 g via INTRAVENOUS
  Filled 2018-02-28: qty 2

## 2018-02-28 MED ORDER — METOPROLOL TARTRATE 25 MG PO TABS
50.0000 mg | ORAL_TABLET | Freq: Two times a day (BID) | ORAL | Status: DC
  Administered 2018-02-28: 50 mg via ORAL
  Filled 2018-02-28: qty 2

## 2018-02-28 MED ORDER — ASPIRIN EC 81 MG PO TBEC
81.0000 mg | DELAYED_RELEASE_TABLET | Freq: Every day | ORAL | Status: DC
  Administered 2018-02-28: 81 mg via ORAL
  Filled 2018-02-28: qty 1

## 2018-02-28 MED ORDER — KETOCONAZOLE 2 % EX CREA
1.0000 "application " | TOPICAL_CREAM | Freq: Every day | CUTANEOUS | Status: DC
  Filled 2018-02-28: qty 15

## 2018-02-28 MED ORDER — INSULIN GLARGINE 100 UNIT/ML ~~LOC~~ SOLN
28.0000 [IU] | Freq: Every day | SUBCUTANEOUS | Status: DC
  Administered 2018-02-28: 28 [IU] via SUBCUTANEOUS
  Filled 2018-02-28: qty 0.28

## 2018-02-28 NOTE — ED Notes (Signed)
Pt to be transported to [facility] per Dr. Vanita Panda.

## 2018-02-28 NOTE — H&P (Signed)
History and Physical    Jonathan Perkins YIR:[id number] DOB: [birth year] DOA: 02/28/2018  Referring MD/NP/PA:   PCP: Toney Sang, MD   Patient coming from:  The patient is coming from home.  At baseline, pt is independent for most of ADL.   Chief Complaint: Fever, chills, confusion  HPI: Jonathan Perkins is a 67 y.o. male with medical history significant of liver transplantation at [facility] (2006, 2009) on cyclosporine, hypertension, hyperlipidemia, diabetes mellitus, GERD, depression, self bladder cath, thrombocytopenia, who presents with fever, chills, confusion.  Per patient's partner, patient started having fever, chills, headache, generalized weakness yesterday. Patient does not have chest pain, shortness breath, cough.  He has nausea and vomited once, currently no nausea, vomiting, diarrhea or abdominal pain.  Patient is doing self bladder cath, denies symptoms of UTI.  No facial droop, slurred speech.  Patient has mild runny nose, no sore throat. Pt was seen in ED last night and was thought to have UTI by EDP. Pt was given 1 dose of Rocephin, and discharged on Keflex. His UA showed trace amount of leukocyte, mild pyuria with WBC 11-20, no bacteria with clear appearance last night. Pt also has bilateral leg edema (left is worse than the right), which has been going for several months.  Lower extremity venous Doppler is negative for DVT. Pt sates that he felt better at time of discharge last night, but soon after returning home, his fever came back, with maximal temperature of greater than 100, though different readings in different thermometers. There is some recurrence of lightheadedness, mild confusion, according to the patient's partner. No rashes. No neck rigidity.  Patient can moves his neck freely. Pt states that he was told that he can take tylenol for pain.  # EDP contact [facility] and planned to transfer [facility] to [facility], but after 12 hours, they still did not have bed available (Pals line/Life line  [phone]). We will admit pt tonight temporarily.  ED Course: pt was found to have WBC 10.0, lactic acid of 0.62, creatinine 1.21, GFR 60, temperature 100.5, tachycardia, tachypnea, oxygen saturation 100% on room air. The repeated UA today showed trace amount of leukocyte, clear experience, rare bacteria, WBC 6-10. Chest x-ray negative.  Patient is placed on telemetry bed of observation.  Review of Systems:   General: has fevers, chills, no body weight gain, has fatigue HEENT: no blurry vision, hearing changes or sore throat Respiratory: no dyspnea, coughing, wheezing CV: no chest pain, no palpitations GI: had nausea, vomiting, no abdominal pain, diarrhea, constipation GU: no dysuria, burning on urination, increased urinary frequency, hematuria  Ext: has leg edema Neuro: no unilateral weakness, numbness, or tingling, no vision change or hearing loss. Has HA and confusion. Skin: no rash, no skin tear. MSK: No muscle spasm, no deformity, no limitation of range of movement in spin Heme: No easy bruising.  Travel history: No recent long distant travel.  Allergy:  Allergies  Allergen Reactions  . Nsaids Other (See Comments)    Pt on transplant medications     Past Medical History:  Diagnosis Date  . Acid reflux   . Anxiety   . Depression   . Diabetes mellitus without complication ([place])   . Hypertension   . Liver transplant recipient [facility])   . Nerve pain   . Overactive bladder     Past Surgical History:  Procedure Laterality Date  . CHOLECYSTECTOMY    . LIVER TRANSPLANT     2006, 2009  . TONSILLECTOMY    . TOTAL  HIP ARTHROPLASTY  1997    Social History:  reports that he has never smoked. He has never used smokeless tobacco. He reports that he does not drink alcohol or use drugs.  Family History:  Family History  Problem Relation Age of Onset  . Pulmonary fibrosis Father   . Heart attack Father      Prior to Admission medications   Medication Sig Start Date End  Date Taking? Authorizing Provider  aspirin EC 81 MG tablet Take 81 mg by mouth at bedtime.   Yes [provider]  cephALEXin (KEFLEX) 500 MG capsule Take 1 capsule (500 mg total) by mouth 4 (four) times daily for 7 days. 02/27/18 03/06/18 Yes Khatri, Hina, PA-C  cloNIDine (CATAPRES) 0.2 MG tablet Take 0.2 mg by mouth 2 (two) times daily.   Yes [provider]  cycloSPORINE modified (GENGRAF) 100 MG capsule Take 100 mg by mouth 2 (two) times daily.   Yes [provider]  desonide (DESOWEN) 0.05 % cream Apply 1 application topically 2 (two) times daily.   Yes [provider]  insulin glargine (LANTUS) 100 UNIT/ML injection Inject 0.4 mLs (40 Units total) into the skin at bedtime. Patient taking differently: Inject 40 Units into the skin daily with supper.  02/27/18  Yes Khatri, Hina, PA-C  ketoconazole (NIZORAL) 2 % cream Apply 1 application topically daily. Rash on bottom   Yes [provider]  metoprolol tartrate (LOPRESSOR) 50 MG tablet Take 50 mg by mouth 2 (two) times daily.    Yes [provider]  metroNIDAZOLE (METROGEL) 1 % gel Apply 1 application topically daily as needed (rosacea).    Yes [provider]  pantoprazole (PROTONIX) 40 MG tablet Take 40 mg by mouth daily after breakfast.   Yes [provider]  pravastatin (PRAVACHOL) 10 MG tablet Take 10 mg by mouth at bedtime.   Yes [provider]  pregabalin (LYRICA) 50 MG capsule Take 50 mg by mouth 2 (two) times daily.    Yes [provider]  tamsulosin (FLOMAX) 0.4 MG CAPS capsule Take 0.4 mg by mouth daily after supper.   Yes [provider]  traMADol (ULTRAM) 50 MG tablet Take 50-100 mg by mouth every 12 (twelve) hours as needed for moderate pain.    Yes [provider]  triamcinolone cream (KENALOG) 0.1 % Apply 1 application topically 2 (two) times daily. Rash on bottom   Yes [provider]  lisinopril (PRINIVIL,ZESTRIL) 10  MG tablet Take 10 mg by mouth daily.    [provider]    Physical Exam: Vitals:   02/28/18 1310 02/28/18 1600 02/28/18 1715 02/28/18 1931  BP: 135/67 (!) 172/95  (!) 140/107  Pulse: 100 95 (!) 104 95  Resp:    20  Temp:      TempSrc:      SpO2: 98% 98% 97% 100%  Weight:      Height:       General: Not in acute distress HEENT:       Eyes: PERRL, EOMI, no scleral icterus.       ENT: No discharge from the ears and nose, no pharynx injection, no tonsillar enlargement.        Neck: No JVD, no bruit, no mass felt. Heme: No neck lymph node enlargement. Cardiac: S1/S2, RRR, No murmurs, No gallops or rubs. Respiratory: No rales, wheezing, rhonchi or rubs. GI: Soft, nondistended, nontender, no rebound pain, no organomegaly, BS present. GU: No hematuria Ext: No pitting leg  edema bilaterally. 2+DP/PT pulse bilaterally. Musculoskeletal: No joint deformities, No joint redness or warmth, no limitation of ROM in spin. Skin: No rashes.  Neuro: mildly confused, oriented to place and person, take time to think year right. Cranial nerves II-XII grossly intact, moves all extremities normally. Neck is supple.  Psych: Patient is not psychotic, no suicidal or hemocidal ideation.  Labs on Admission: I have personally reviewed following labs and imaging studies  CBC: Recent Labs  Lab 02/27/18 2148 02/28/18 0817  WBC 7.9 10.0  NEUTROABS 6.5 8.6*  HGB 12.1* 12.3*  HCT 36.4* 37.3*  MCV 88.1 88.6  PLT 79* 76*   Basic Metabolic Panel: Recent Labs  Lab 02/27/18 2148 02/28/18 0817  NA 141 139  K 4.2 4.1  CL 108 106  CO2 24 22  GLUCOSE 137* 155*  BUN 24* 19  CREATININE 1.39* 1.21  CALCIUM 9.1 9.2   GFR: Estimated Creatinine Clearance: 85 mL/min (by C-G formula based on SCr of 1.21 mg/dL). Liver Function Tests: Recent Labs  Lab 02/27/18 2148 02/28/18 0817  AST 28 27  ALT 33 37  ALKPHOS 68 63  BILITOT 0.7 0.7  PROT 6.4* 6.7  ALBUMIN 3.8 3.8   No results for input(s):  LIPASE, AMYLASE in the last 168 hours. Recent Labs  Lab 02/28/18 0817  AMMONIA 20   Coagulation Profile: No results for input(s): INR, PROTIME in the last 168 hours. Cardiac Enzymes: No results for input(s): CKTOTAL, CKMB, CKMBINDEX, TROPONINI in the last 168 hours. BNP (last 3 results) No results for input(s): PROBNP in the last 8760 hours. HbA1C: No results for input(s): HGBA1C in the last 72 hours. CBG: Recent Labs  Lab 02/28/18 0930  GLUCAP 127*   Lipid Profile: No results for input(s): CHOL, HDL, LDLCALC, TRIG, CHOLHDL, LDLDIRECT in the last 72 hours. Thyroid Function Tests: No results for input(s): TSH, T4TOTAL, FREET4, T3FREE, THYROIDAB in the last 72 hours. Anemia Panel: No results for input(s): VITAMINB12, FOLATE, FERRITIN, TIBC, IRON, RETICCTPCT in the last 72 hours. Urine analysis:    Component Value Date/Time   COLORURINE YELLOW 02/28/2018 [place] 02/28/2018 0817   LABSPEC 1.020 02/28/2018 0817   PHURINE 6.0 02/28/2018 0817   GLUCOSEU NEGATIVE 02/28/2018 0817   HGBUR MODERATE (A) 02/28/2018 0817   BILIRUBINUR NEGATIVE 02/28/2018 0817   KETONESUR TRACE (A) 02/28/2018 0817   PROTEINUR TRACE (A) 02/28/2018 0817   NITRITE NEGATIVE 02/28/2018 0817   LEUKOCYTESUR TRACE (A) 02/28/2018 0817   Sepsis Labs: @LABRCNTIP (procalcitonin:4,lacticidven:4) )No results found for this or any previous visit (from the past 240 hour(s)).   Radiological Exams on Admission: Dg Chest 2 View  Result Date: 02/27/2018 CLINICAL DATA:  Fever EXAM: CHEST - 2 VIEW COMPARISON:  None. FINDINGS: Right base atelectasis. Left lung clear. Heart is normal size. No effusions or acute bony abnormality. IMPRESSION: Right base atelectasis. Electronically Signed   By: Rolm Baptise M.D.   On: 02/27/2018 23:01   Ct Head Wo Contrast  Result Date: 02/28/2018 CLINICAL DATA:  Altered mental status. Status post liver transplant. EXAM: CT HEAD WITHOUT CONTRAST TECHNIQUE: Contiguous axial  images were obtained from the base of the skull through the vertex without intravenous contrast. COMPARISON:  None. FINDINGS: Brain: There is no mass, hemorrhage or extra-axial collection. There is generalized atrophy without lobar predilection. There is no acute or chronic infarction. There is hypoattenuation of the periventricular white matter, most commonly indicating chronic ischemic microangiopathy. Vascular: No abnormal hyperdensity of the major intracranial arteries or dural  venous sinuses. No intracranial atherosclerosis. Skull: The visualized skull base, calvarium and extracranial soft tissues are normal. Sinuses/Orbits: No fluid levels or advanced mucosal thickening of the visualized paranasal sinuses. No mastoid or middle ear effusion. The orbits are normal. Electronically Signed   By: Ulyses Jarred M.D.   On: 02/28/2018 22:13   Dg Chest Port 1 View  Result Date: 02/28/2018 CLINICAL DATA:  67 year old male with fever, vomiting and weakness EXAM: PORTABLE CHEST 1 VIEW COMPARISON:  Prior chest x-ray 02/27/2018 FINDINGS: Cardiac and mediastinal contours remain unchanged. The lungs remain clear. No new airspace opacity, pleural effusion or pneumothorax. No evidence of pulmonary edema. Chronic bronchitic changes are similar. No acute osseous abnormality. IMPRESSION: Stable chest x-ray.  No acute cardiopulmonary process. Electronically Signed   By: Jacqulynn Cadet M.D.   On: 02/28/2018 08:59     EKG: Independently reviewed.  Sinus rhythm, QTC 425, Q waves in inferior leads, early R wave progression, nonspecific T wave change.  Assessment/Plan Principal Problem:   Sepsis (Assumption) Active Problems:   Acid reflux   Diabetes mellitus without complication (HCC)   Hypertension   Liver transplant recipient [facility])   UTI (urinary tract infection)   Acute metabolic encephalopathy   Thrombocytopenia (HCC)   Leg swelling   Sepsis [facility]): Patient meets criteria for sepsis with fever, tachycardia and  tachypnea.  No leukocytosis.  Lactic acid is normal.  Currently hemodynamically stable.  Source of infection is not completely clear.  The urinalysis done yesterday in ED showed mild pyuria, indicating possible UTI.  Patient has headache and mild confusion, but he does not have meningeal sign.  Neck is supple without any rigidity, low suspicions for meningitis. He has mild runny nose, will need to rule out upper respiratory viral infection. Since pt is immunosuppressed, will broaden antibiotic coverage.  -will place on tele bed for obs -start vancomycin and cefepime -Check respiratory virus panel -Blood culture, urine culture -will get Procalcitonin and trend lactic acid levels per sepsis protocol. -IVF: 2L of NS bolus in ED, followed by 125 cc/h  -need to transfer back to [facility] when bed is available.  Possible UTI: -see above  Acute metabolic encephalopathy: likely due to sepsis and possible UTI -Frequent neuro check -f/u Ct-head  GERD: -Protonix  Self bladder cath: -f/u urine cutlture -continue flomax  Diabetes mellitus without complication [facility]): Patient is taking lantus at home -will decrease Lantus dose from 40 to 28 U daily -SSI  Hypertension: -will decreased clonidine dose from 0.2-0.1 milligrams twice daily since patient is the risk of developing hypotension secondary to sepsis. -IV hydralazine as needed -Continue metoprolol  Liver transplant recipient [facility]): LFT normal -on cyclosporine  Thrombocytopenia ([employer]): This is chronic issue.  Platelet was 112 on 09/29/2017--> 76 today.  No bleeding tendency.  Likely related to liver disease. -Check LDH and peripheral smear to rule out schistocytes and TTP -Follow-up by CBC  Leg swelling: Etiology is not clear.  Lower extremity venous Dopplers negative for DVT. - Check BNP     DVT ppx: SCD Code Status: Full code Family Communication:   Yes, patient's partner    at bed side Disposition Plan:  Anticipate discharge back to  previous home environment Consults called:  EDP contacted [facility] Admission status: Obs / tele    Date of Service 02/28/2018    Ivor Costa [facility] Pager [phone]  If 7PM-7AM, please contact night-coverage [email or web address] Password [email or web address] 02/28/2018, 10:28 PM

## 2018-02-28 NOTE — Progress Notes (Signed)
Pharmacy Antibiotic Note  Jonathan Perkins is a 67 y.o. male admitted on 02/28/2018 with sepsis with unknown source in an immunocompromised patient.  PMH significant for Hepatitis C and s/p liver transplant 10 yrs ago, HTN, DM.  Patient awaiting transfer to [facility].  Pharmacy has been consulted for Cefepime and Vancomycin dosing.  Plan:  Vancomycin 2500mg  (20mg /kg) IV x 1 loading dose followed by 1750mg  IV q24h (Goal AUC 400-500)  Cefepime 2gm IV x 1 dose followed by 1gm IV q8h  Follow renal function  Follow culture results and sensitivities  Height: 6\' 2"  (188 cm) Weight: 264 lb 15.9 oz (120.2 kg) IBW/kg (Calculated) : 82.2  Temp (24hrs), Avg:99.4 F (37.4 C), Min:99.4 F (37.4 C), Max:99.4 F (37.4 C)  Recent Labs  Lab 02/27/18 2148 02/27/18 2150 02/28/18 0817  WBC 7.9  --  10.0  CREATININE 1.39*  --  1.21  LATICACIDVEN  --  0.62  --     Estimated Creatinine Clearance: 85 mL/min (by C-G formula based on SCr of 1.21 mg/dL).    Allergies  Allergen Reactions  . Nsaids Other (See Comments)    Pt on transplant medications     Antimicrobials this admission: 8/25 Ceftriaxone x 1 dose 8/26 Vanc >>   8/26 Cefepime >>  Dose adjustments this admission:    Microbiology results: 8/26 BCx: sent 8/26 UCx: sent  8/26 Sputum: sent   Thank you for allowing pharmacy to be a part of this patient's care.  Everette Rank, PharmD 02/28/2018 8:54 PM

## 2018-02-28 NOTE — ED Notes (Signed)
ED TO INPATIENT HANDOFF REPORT  Name/Age/Gender Jonathan Perkins 67 y.o. male  Code Status    Code Status Orders  (From admission, onward)         Start     Ordered   02/28/18 2047  Full code  Continuous     02/28/18 2047        Code Status History    This patient has a current code status but no historical code status.    Advance Directive Documentation     Most Recent Value  Type of Advance Directive  Living will, Healthcare Power of Attorney  Pre-existing out of facility DNR order (yellow form or pink MOST form)  -  "MOST" Form in Place?  -      Home/SNF/Other Home  Chief Complaint fever/emesis  Level of Care/Admitting Diagnosis ED Disposition    ED Disposition Condition [place]: [place] [100102]  Level of Care: Telemetry [5]  Admit to tele based on following criteria: Other see comments  Comments: sepsis  Diagnosis: Sepsis [facility]) [[id number]]  Admitting Physician: Ivor Costa [[id number]]  Attending Physician: Ivor Costa [[id number]]  PT Class (Do Not Modify): Observation [104]  PT Acc Code (Do Not Modify): Observation [10022]       Medical History Past Medical History:  Diagnosis Date  . Acid reflux   . Anxiety   . Depression   . Diabetes mellitus without complication ([place])   . Hypertension   . Liver transplant recipient [facility])   . Nerve pain   . Overactive bladder     Allergies Allergies  Allergen Reactions  . Nsaids Other (See Comments)    Pt on transplant medications     IV Location/Drains/Wounds Patient Lines/Drains/Airways Status   Active Line/Drains/Airways    Name:   Placement date:   Placement time:   Site:   Days:   Peripheral IV 02/28/18 Left Hand   02/28/18    0936    Hand   less than 1          Labs/Imaging Results for orders placed or performed during the hospital encounter of 02/28/18 (from the past 48 hour(s))  Comprehensive metabolic panel     Status: Abnormal   Collection Time:  02/28/18  8:17 AM  Result Value Ref Range   Sodium 139 135 - 145 mmol/L   Potassium 4.1 3.5 - 5.1 mmol/L   Chloride 106 98 - 111 mmol/L   CO2 22 22 - 32 mmol/L   Glucose, Bld 155 (H) 70 - 99 mg/dL   BUN 19 8 - 23 mg/dL   Creatinine, Ser 1.21 0.61 - 1.24 mg/dL   Calcium 9.2 8.9 - 10.3 mg/dL   Total Protein 6.7 6.5 - 8.1 g/dL   Albumin 3.8 3.5 - 5.0 g/dL   AST 27 15 - 41 U/L   ALT 37 0 - 44 U/L   Alkaline Phosphatase 63 38 - 126 U/L   Total Bilirubin 0.7 0.3 - 1.2 mg/dL   GFR calc non Af Amer >60 >60 mL/min   GFR calc Af Amer >60 >60 mL/min    Comment: (NOTE) The eGFR has been calculated using the CKD EPI equation. This calculation has not been validated in all clinical situations. eGFR's persistently <60 mL/min signify possible Chronic Kidney Disease.    Anion gap 11 5 - 15    Comment: Performed at [facility], [place] [address], [place], [place] [address]  CBC WITH DIFFERENTIAL  Status: Abnormal   Collection Time: 02/28/18  8:17 AM  Result Value Ref Range   WBC 10.0 4.0 - 10.5 K/uL   RBC 4.21 (L) 4.22 - 5.81 MIL/uL   Hemoglobin 12.3 (L) 13.0 - 17.0 g/dL   HCT 37.3 (L) 39.0 - 52.0 %   MCV 88.6 78.0 - 100.0 fL   MCH 29.2 26.0 - 34.0 pg   MCHC 33.0 30.0 - 36.0 g/dL   RDW 14.3 11.5 - 15.5 %   Platelets 76 (L) 150 - 400 K/uL    Comment: CONSISTENT WITH PREVIOUS RESULT   Neutrophils Relative % 85 %   Neutro Abs 8.6 (H) 1.7 - 7.7 K/uL   Lymphocytes Relative 8 %   Lymphs Abs 0.8 0.7 - 4.0 K/uL   Monocytes Relative 7 %   Monocytes Absolute 0.7 0.1 - 1.0 K/uL   Eosinophils Relative 0 %   Eosinophils Absolute 0.0 0.0 - 0.7 K/uL   Basophils Relative 0 %   Basophils Absolute 0.0 0.0 - 0.1 K/uL    Comment: Performed at [facility], [place] [address], [place], [place] [address]  Urinalysis, Complete w Microscopic     Status: Abnormal   Collection Time: 02/28/18  8:17 AM  Result Value Ref Range   Color, Urine YELLOW YELLOW   APPearance CLEAR  CLEAR   Specific Gravity, Urine 1.020 1.005 - 1.030   pH 6.0 5.0 - 8.0   Glucose, UA NEGATIVE NEGATIVE mg/dL   Hgb urine dipstick MODERATE (A) NEGATIVE   Bilirubin Urine NEGATIVE NEGATIVE   Ketones, ur TRACE (A) NEGATIVE mg/dL   Protein, ur TRACE (A) NEGATIVE mg/dL   Nitrite NEGATIVE NEGATIVE   Leukocytes, UA TRACE (A) NEGATIVE   Squamous Epithelial / LPF 0-5 0 - 5   WBC, UA 6-10 0 - 5 WBC/hpf   RBC / HPF 6-10 0 - 5 RBC/hpf   Bacteria, UA RARE (A) NONE SEEN   Mucus PRESENT    Sperm, UA PRESENT     Comment: Performed at [facility], [place] [address] [place], [place] [address]  Ammonia     Status: None   Collection Time: 02/28/18  8:17 AM  Result Value Ref Range   Ammonia 20 9 - 35 umol/L    Comment: Performed at [facility], [place] [address], [place], [place] [address]  Lactate dehydrogenase     Status: None   Collection Time: 02/28/18  8:17 AM  Result Value Ref Range   LDH 118 98 - 192 U/L    Comment: Performed at [facility], [place] [address], [place],  [address]  CBG monitoring, ED     Status: Abnormal   Collection Time: 02/28/18  9:30 AM  Result Value Ref Range   Glucose-Capillary 127 (H) 70 - 99 mg/dL   Dg Chest 2 View  Result Date: 02/27/2018 CLINICAL DATA:  Fever EXAM: CHEST - 2 VIEW COMPARISON:  None. FINDINGS: Right base atelectasis. Left lung clear. Heart is normal size. No effusions or acute bony abnormality. IMPRESSION: Right base atelectasis. Electronically Signed   By: Rolm Baptise M.D.   On: 02/27/2018 23:01   Dg Chest Port 1 View  Result Date: 02/28/2018 CLINICAL DATA:  67 year old male with fever, vomiting and weakness EXAM: PORTABLE CHEST 1 VIEW COMPARISON:  Prior chest x-ray 02/27/2018 FINDINGS: Cardiac and mediastinal contours remain unchanged. The lungs remain clear. No new airspace opacity, pleural effusion or pneumothorax. No evidence of pulmonary edema. Chronic bronchitic changes  are similar. No  acute osseous abnormality. IMPRESSION: Stable chest x-ray.  No acute cardiopulmonary process. Electronically Signed   By: Jacqulynn Cadet M.D.   On: 02/28/2018 08:59    Pending Labs Unresulted Labs (From admission, onward)    Start     Ordered   03/01/18 4854  Basic metabolic panel  Tomorrow morning,   R     02/28/18 2047   03/01/18 0500  CBC  Tomorrow morning,   R     02/28/18 2047   02/28/18 2046  HIV antibody (Routine Testing)  Once,   R     02/28/18 2047   02/28/18 2045  Brain natriuretic peptide  Once,   R     02/28/18 2044   02/28/18 2044  Respiratory Panel by PCR  (Respiratory virus panel)  Once,   R     02/28/18 2043   02/28/18 2044  Ammonia  Once,   R     02/28/18 2044   02/28/18 2044  Save smear  Once,   R     02/28/18 2044   02/28/18 2044  Pathologist smear review  Once,   R     02/28/18 2044   02/28/18 2043  Urine Culture  Once,   R    Comments:  Please get clean catch specimen ONLY (DO NOT obtain from urinal)    02/28/18 2042   02/28/18 2042  Lactic acid, plasma  Once,   STAT     02/28/18 2041   02/28/18 2042  Protime-INR  STAT,   R     02/28/18 2041   02/28/18 2041  Culture, blood (x 2)  BLOOD CULTURE X 2,   STAT    Comments:  INITIATE ANTIBIOTICS WITHIN 1 HOUR AFTER BLOOD CULTURES DRAWN.  If unable to obtain blood cultures, call MD immediately regarding antibiotic instructions.    02/28/18 2041   02/28/18 2041  Procalcitonin  STAT,   R     02/28/18 2041          Vitals/Pain Today's Vitals   02/28/18 1600 02/28/18 1715 02/28/18 1931 02/28/18 1932  BP: (!) 172/95  (!) 140/107   Pulse: 95 (!) 104 95   Resp:   20   Temp:      TempSrc:      SpO2: 98% 97% 100%   Weight:      Height:      PainSc:    8     Isolation Precautions Droplet precaution  Medications Medications  sodium chloride 0.9 % bolus 1,000 mL (0 mLs Intravenous Stopped 02/28/18 1132)    And  0.9 %  sodium chloride infusion ( Intravenous Hold 02/28/18 1152)  HYDROmorphone (DILAUDID)  injection 1 mg (1 mg Intravenous Given 02/28/18 2009)  ondansetron (ZOFRAN) injection 4 mg (has no administration in time range)  aspirin EC tablet 81 mg (has no administration in time range)  traMADol (ULTRAM) tablet 50 mg (has no administration in time range)  metoprolol tartrate (LOPRESSOR) tablet 50 mg (has no administration in time range)  insulin glargine (LANTUS) injection 28 Units (has no administration in time range)  pantoprazole (PROTONIX) EC tablet 40 mg (has no administration in time range)  tamsulosin (FLOMAX) capsule 0.4 mg (has no administration in time range)  cycloSPORINE modified (GENGRAF) capsule 100 mg (has no administration in time range)  pregabalin (LYRICA) capsule 50 mg (has no administration in time range)  desonide (DESOWEN) 6.27 % cream 1 application (has no administration in time range)  ketoconazole (  NIZORAL) 2 % cream 1 application (has no administration in time range)  metroNIDAZOLE (METROGEL) 1 % gel 1 application (has no administration in time range)  triamcinolone cream (KENALOG) 0.1 % 1 application (has no administration in time range)  cloNIDine (CATAPRES) tablet 0.1 mg (has no administration in time range)  insulin aspart (novoLOG) injection 0-9 Units (has no administration in time range)  acetaminophen (TYLENOL) tablet 650 mg (has no administration in time range)    Or  acetaminophen (TYLENOL) suppository 650 mg (has no administration in time range)  senna-docusate (Senokot-S) tablet 1 tablet (has no administration in time range)  hydrALAZINE (APRESOLINE) injection 5 mg (has no administration in time range)  zolpidem (AMBIEN) tablet 5 mg (has no administration in time range)  vancomycin (VANCOCIN) 2,500 mg in sodium chloride 0.9 % 500 mL IVPB (has no administration in time range)  ceFEPIme (MAXIPIME) 2 g in sodium chloride 0.9 % 100 mL IVPB (has no administration in time range)  ceFEPIme (MAXIPIME) 1 g in sodium chloride 0.9 % 100 mL IVPB (has no  administration in time range)  vancomycin (VANCOCIN) 1,750 mg in sodium chloride 0.9 % 500 mL IVPB (has no administration in time range)  HYDROmorphone (DILAUDID) injection 1 mg (1 mg Intravenous Given 02/28/18 0935)  HYDROmorphone (DILAUDID) injection 1 mg (1 mg Intravenous Given 02/28/18 0936)  ondansetron (ZOFRAN) injection 4 mg (4 mg Intravenous Given 02/28/18 0936)  sodium chloride 0.9 % bolus 1,000 mL (0 mLs Intravenous Stopped 02/28/18 1347)  prochlorperazine (COMPAZINE) injection 10 mg (10 mg Intravenous Given 02/28/18 1136)    Mobility Walks   Delay in transport due to head CT being ordered. Patient to go up after head CT.

## 2018-02-28 NOTE — ED Provider Notes (Addendum)
  Physical Exam  BP (!) 140/107 (BP Location: Right Arm)   Pulse 95   Temp 99.4 F (37.4 C) (Oral)   Resp 20   Ht 6\' 2"  (1.88 m)   Wt 120.2 kg   SpO2 100%   BMI 34.02 kg/m   Physical Exam  ED Course/Procedures     Procedures  MDM  Patient supposed to be transferred to [facility] since he is a liver transplant patient and is on immunosuppressives and has a fever. He has been accepted to [facility] but there is no beds there. He has been in the ED over 12 hours so will call hospitalist to admit overnight and transfer to [facility] when there is a bed there.   9:55 PM Right after patient had a bed upstairs, [facility] called and there is a bed for him at [facility]. Accepting doctor is Dr. Delcie Roch. Patient will need updated EMTALA when bed is ready and patient is ready to be transferred.       Drenda Freeze, MD 02/28/18 2014    Drenda Freeze, MD 02/28/18 2156

## 2018-02-28 NOTE — ED Triage Notes (Signed)
Patient was seen here last night for fever, vomiting, and weakness. Patient discharged and returned due to unresolved symptoms. Patient supposed to be seen today for vascular ultrasound of left leg. Hx liver transplant.

## 2018-02-28 NOTE — Discharge Summary (Addendum)
Physician Discharge Summary  Jonathan Perkins RJJ:[id number] DOB: 12/01/1950 DOA: 02/28/2018  PCP: Toney Sang, MD  Admit date: 02/28/2018 Discharge date: 03/01/2018  Time spent: 36 minutes  Recommendations for Outpatient Follow-up:  1. Pt is transferred to [facility]    Discharge Diagnoses:  Principal Problem:   Sepsis ([place]) Active Problems:   Acid reflux   Diabetes mellitus without complication ([place])   Hypertension   Liver transplant recipient [facility])   UTI (urinary tract infection)   Acute metabolic encephalopathy   Thrombocytopenia ([facility])   Leg swelling   Discharge Condition: stable  Diet recommendation: heart/carb diet  Filed Weights   02/28/18 0743  Weight: 120.2 kg    History of present illness:   Jonathan Perkins is a 67 y.o. male with medical history significant of liver transplantation at [facility] (2006, 2009) on cyclosporine, hypertension, hyperlipidemia, diabetes mellitus, GERD, depression, self bladder cath, thrombocytopenia, who presents with fever, chills, confusion.  Per patient's partner, patient started having fever, chills, headache, generalized weakness yesterday. Patient does not have chest pain, shortness breath, cough.  He has nausea and vomited once, currently no nausea, vomiting, diarrhea or abdominal pain.  Patient is doing self bladder cath, denies symptoms of UTI.  No facial droop, slurred speech.  Patient has mild runny nose, no sore throat. Pt was seen in ED last night and was thought to have UTI by EDP. Pt was given 1 dose of Rocephin, and discharged on Keflex. His UA showed trace amount of leukocyte, mild pyuria with WBC 11-20, no bacteria with clear appearance last night. Pt also has bilateral leg edema (left is worse than the right), which has been going for several months.  Lower extremity venous Doppler is negative for DVT. Pt sates that he felt better at time of discharge last night, but soon after returning home, his fever came back, with maximal  temperature of greater than 100, though different readings in different thermometers. There is some recurrence of lightheadedness, mild confusion, according to the patient's partner. No rashes. No neck rigidity.  Patient can moves his neck freely. Pt states that he was told that he can take tylenol for pain.  ED Course: pt was found to have WBC 10.0, lactic acid of 0.62, creatinine 1.21, GFR 60, temperature 100.5, tachycardia, tachypnea, oxygen saturation 100% on room air. The repeated UA today showed trace amount of leukocyte, clear experience, rare bacteria, WBC 6-10. Chest x-ray negative.  Patient is placed on telemetry bed of observation.   Hospital Course:   Initially EDP contact [facility] and planned to transfer [facility] to [facility], but after 12 hours, they still did not have bed available (Pals line/Life line [phone]). We admitted pt to [facility]. Later on, we are informed that the bed becomes available in [facility], therefore, pt will be transferred to [facility].  Sepsis [facility]): Patient meets criteria for sepsis with fever, tachycardia and tachypnea.  No leukocytosis.  Lactic acid is normal.  Currently hemodynamically stable.  Source of infection is not completely clear.  The urinalysis done yesterday in ED showed mild pyuria, indicating possible UTI.  Patient has headache and mild confusion, but he does not have meningeal sign.  Neck is supple without any rigidity, low suspicions for meningitis. He has mild runny nose, will need to rule out upper respiratory viral infection. Since pt is immunosuppressed, will broaden antibiotic coverage.  -placed on tele bed for obs -started vancomycin and cefepime -Checked respiratory virus panel -f/u Blood culture, urine culture -Procalcitonin and trend lactic acid levels per sepsis  protocol. -IVF: 2L of NS bolus in ED, followed by 125 cc/h   Possible UTI: -see above  Acute metabolic encephalopathy: likely due to sepsis and possible UTI -Frequent neuro check -f/u  Ct-head  GERD: -Protonix  Self bladder cath: -f/u urine cutlture -continued flomax  Diabetes mellitus without complication [facility]): Patient is taking lantus at home -decreased Lantus dose from 40 to 28 U daily -SSI  Hypertension: -will decreased clonidine dose from 0.2-0.1 milligrams twice daily since patient is the risk of developing hypotension secondary to sepsis. -IV hydralazine as needed -Continued metoprolol  Liver transplant recipient [facility]): LFT normal -on cyclosporine  Thrombocytopenia ([place]): This is chronic issue.  Platelet was 112 on 09/29/2017--> 76 today.  No bleeding tendency.  Likely related to liver disease. -Check LDH and peripheral smear to rule out schistocytes and TTP -Follow-up by CBC  Leg swelling: Etiology is not clear.  Lower extremity venous Dopplers negative for DVT. - f/u BNP   Procedures:  none (i.e. Studies not automatically included, echos, thoracentesis, etc; not x-rays)  Consultations:  none  Discharge Exam: Vitals:   03/01/18 0206 03/01/18 0453  BP: 138/85 (!) 155/75  Pulse: 72 78  Resp: 16 16  Temp:    SpO2: 99% 98%   General: Not in acute distress HEENT:       Eyes: PERRL, EOMI, no scleral icterus.       ENT: No discharge from the ears and nose, no pharynx injection, no tonsillar enlargement.        Neck: No JVD, no bruit, no mass felt. Heme: No neck lymph node enlargement. Cardiac: S1/S2, RRR, No murmurs, No gallops or rubs. Respiratory: No rales, wheezing, rhonchi or rubs. GI: Soft, nondistended, nontender, no rebound pain, no organomegaly, BS present. GU: No hematuria Ext: No pitting leg edema bilaterally. 2+DP/PT pulse bilaterally. Musculoskeletal: No joint deformities, No joint redness or warmth, no limitation of ROM in spin. Skin: No rashes.  Neuro: mildly confused, oriented to place and person, take time to think year right. Cranial nerves II-XII grossly intact, moves all extremities normally. Neck is supple.   Psych: Patient is not psychotic, no suicidal or hemocidal ideation.   Discharge Instructions -pt will be transferred to [facility], will continue current treatment.   Allergies as of 02/28/2018      Reactions   Nsaids Other (See Comments)   Pt on transplant medications     Med Rec must be completed prior to using this [place]      Allergies  Allergen Reactions  . Nsaids Other (See Comments)    Pt on transplant medications       The results of significant diagnostics from this hospitalization (including imaging, microbiology, ancillary and laboratory) are listed below for reference.    Significant Diagnostic Studies: Dg Chest 2 View  Result Date: 02/27/2018 CLINICAL DATA:  Fever EXAM: CHEST - 2 VIEW COMPARISON:  None. FINDINGS: Right base atelectasis. Left lung clear. Heart is normal size. No effusions or acute bony abnormality. IMPRESSION: Right base atelectasis. Electronically Signed   By: Rolm Baptise M.D.   On: 02/27/2018 23:01   Ct Head Wo Contrast  Result Date: 02/28/2018 CLINICAL DATA:  Altered mental status. Status post liver transplant. EXAM: CT HEAD WITHOUT CONTRAST TECHNIQUE: Contiguous axial images were obtained from the base of the skull through the vertex without intravenous contrast. COMPARISON:  None. FINDINGS: Brain: There is no mass, hemorrhage or extra-axial collection. There is generalized atrophy without lobar predilection. There is no acute or chronic infarction.  There is hypoattenuation of the periventricular white matter, most commonly indicating chronic ischemic microangiopathy. Vascular: No abnormal hyperdensity of the major intracranial arteries or dural venous sinuses. No intracranial atherosclerosis. Skull: The visualized skull base, calvarium and extracranial soft tissues are normal. Sinuses/Orbits: No fluid levels or advanced mucosal thickening of the visualized paranasal sinuses. No mastoid or middle ear effusion. The orbits are normal. Electronically  Signed   By: Ulyses Jarred M.D.   On: 02/28/2018 22:13   Dg Chest Port 1 View  Result Date: 02/28/2018 CLINICAL DATA:  67 year old male with fever, vomiting and weakness EXAM: PORTABLE CHEST 1 VIEW COMPARISON:  Prior chest x-ray 02/27/2018 FINDINGS: Cardiac and mediastinal contours remain unchanged. The lungs remain clear. No new airspace opacity, pleural effusion or pneumothorax. No evidence of pulmonary edema. Chronic bronchitic changes are similar. No acute osseous abnormality. IMPRESSION: Stable chest x-ray.  No acute cardiopulmonary process. Electronically Signed   By: Jacqulynn Cadet M.D.   On: 02/28/2018 08:59    Microbiology: Recent Results (from the past 240 hour(s))  Urine culture     Status: Abnormal   Collection Time: 02/27/18  9:01 PM  Result Value Ref Range Status   Specimen Description   Final    URINE, CATHETERIZED Performed at [facility] [address] [place], [place] [address]    Special Requests   Final    NONE Performed at [facility], [place] [address] [place], [place] [address]    Culture 10,000 COLONIES/mL ENTEROCOCCUS FAECALIS (A)  Final   Report Status 03/02/2018 FINAL  Final   Organism ID, Bacteria ENTEROCOCCUS FAECALIS (A)  Final      Susceptibility   Enterococcus faecalis - MIC*    AMPICILLIN <=2 SENSITIVE Sensitive     LEVOFLOXACIN 1 SENSITIVE Sensitive     NITROFURANTOIN <=16 SENSITIVE Sensitive     VANCOMYCIN 2 SENSITIVE Sensitive     * 10,000 COLONIES/mL ENTEROCOCCUS FAECALIS  Blood culture (routine x 2)     Status: None (Preliminary result)   Collection Time: 02/27/18  9:43 PM  Result Value Ref Range Status   Specimen Description   Final    BLOOD RIGHT HAND Performed at [facility] [address], [place], [place] [address]    Special Requests   Final    BOTTLES DRAWN AEROBIC AND ANAEROBIC Blood Culture adequate volume Performed at [facility] [address] [place], [place] [address]    Culture   Final    NO GROWTH 2 DAYS Performed at [facility] [address], [place], [place] [address]    Report Status PENDING  Incomplete  Blood culture (routine x 2)     Status: None (Preliminary result)   Collection Time: 02/27/18  9:50 PM  Result Value Ref Range Status   Specimen Description   Final    BLOOD RIGHT ARM Performed at [place] [address], [place], [place] [address]    Special Requests   Final    BOTTLES DRAWN AEROBIC AND ANAEROBIC Blood Culture results may not be optimal due to an excessive volume of blood received in culture bottles Performed at [facility] [address] [place], [place] [address]    Culture   Final    NO GROWTH 2 DAYS Performed at [place] [address] [place] [address] [place], [place] [address]    Report Status PENDING  Incomplete     Labs: Basic Metabolic Panel:  Recent Labs  Lab 02/27/18 2148 02/28/18 0817  NA 141 139  K 4.2 4.1  CL 108 106  CO2 24 22  GLUCOSE 137* 155*  BUN 24* 19  CREATININE 1.39* 1.21  CALCIUM 9.1 9.2   Liver Function Tests: Recent Labs  Lab 02/27/18 2148 02/28/18 0817  AST 28 27  ALT 33 37  ALKPHOS 68 63  BILITOT 0.7 0.7  PROT 6.4* 6.7  ALBUMIN 3.8 3.8   No results for input(s): LIPASE, AMYLASE in the last 168 hours. Recent Labs  Lab 02/28/18 0817 02/28/18 2200  AMMONIA 20 27   CBC: Recent Labs  Lab 02/27/18 2148 02/28/18 0817  WBC 7.9 10.0  NEUTROABS 6.5 8.6*  HGB 12.1* 12.3*  HCT 36.4* 37.3*  MCV 88.1 88.6  PLT 79* 76*   Cardiac Enzymes: No results for input(s): CKTOTAL, CKMB, CKMBINDEX, TROPONINI in the last 168 hours. BNP: BNP (last 3 results) Recent Labs    02/28/18 2045  BNP 74.3    ProBNP (last 3 results) No results for input(s): PROBNP in the last 8760 hours.  CBG: Recent Labs  Lab 02/28/18 0930 02/28/18 2248  GLUCAP 127* 114*       Signed:  Ivor Costa  [facility] 03/02/2018, 11:31 AM

## 2018-02-28 NOTE — Progress Notes (Signed)
Left lower extremity venous duplex completed. Preliminary results - There is no evidence of DVT or Baker's cyst. Toma Copier, RVS 02/28/2018, 9:27 AM

## 2018-02-28 NOTE — ED Provider Notes (Signed)
[facility] [place] DEPT Provider Note   CSN: [id number] Arrival date & time: 02/28/18  0734     History   Chief Complaint Chief Complaint  Patient presents with  . Fever  . Leg Pain  . Nausea  . Emesis    HPI Jonathan Perkins is a 68 y.o. male.  HPI Patient presents for the second time in 12 hours now with concern of recurrent fever, headache, generalized discomfort, diaphoresis. Patient notes that after evaluation yesterday, he was feeling substantially better, went home, but soon after returning home his fever came back, with a maximum temperature of greater than 100, though different readings in different thermometers. There is some recurrence of lightheadedness, mild confusion, according to the patient's partner. The patient himself denies frank disorientation, new focal weakness, describes generalized weakness. Since recurrence of aforementioned changes, no clear alleviating or exacerbating factors. Patient has multiple medical issues including liver transplant x2, well-documented on notes from yesterday.  Past Medical History:  Diagnosis Date  . Acid reflux   . Anxiety   . Depression   . Diabetes mellitus without complication ([place])   . Hypertension   . Liver transplant recipient [facility])   . Nerve pain   . Overactive bladder     There are no active problems to display for this patient.   Past Surgical History:  Procedure Laterality Date  . CHOLECYSTECTOMY    . LIVER TRANSPLANT     2006, 2009  . TONSILLECTOMY    . TOTAL HIP ARTHROPLASTY  1997        Home Medications    Prior to Admission medications   Medication Sig Start Date End Date Taking? Authorizing Provider  aspirin EC 81 MG tablet Take 81 mg by mouth at bedtime.   Yes [provider]  cephALEXin (KEFLEX) 500 MG capsule Take 1 capsule (500 mg total) by mouth 4 (four) times daily for 7 days. 02/27/18 03/06/18 Yes Khatri, Hina, PA-C  cloNIDine (CATAPRES) 0.2 MG tablet Take  0.2 mg by mouth 2 (two) times daily.   Yes [provider]  cycloSPORINE modified (GENGRAF) 100 MG capsule Take 100 mg by mouth 2 (two) times daily.   Yes [provider]  desonide (DESOWEN) 0.05 % cream Apply 1 application topically 2 (two) times daily.   Yes [provider]  insulin glargine (LANTUS) 100 UNIT/ML injection Inject 0.4 mLs (40 Units total) into the skin at bedtime. Patient taking differently: Inject 40 Units into the skin daily with supper.  02/27/18  Yes Khatri, Hina, PA-C  ketoconazole (NIZORAL) 2 % cream Apply 1 application topically daily. Rash on bottom   Yes [provider]  metoprolol tartrate (LOPRESSOR) 50 MG tablet Take 50 mg by mouth 2 (two) times daily.    Yes [provider]  metroNIDAZOLE (METROGEL) 1 % gel Apply 1 application topically daily as needed (rosacea).    Yes [provider]  pantoprazole (PROTONIX) 40 MG tablet Take 40 mg by mouth daily after breakfast.   Yes [provider]  pravastatin (PRAVACHOL) 10 MG tablet Take 10 mg by mouth at bedtime.   Yes [provider]  pregabalin (LYRICA) 50 MG capsule Take 50 mg by mouth 2 (two) times daily.    Yes [provider]  tamsulosin (FLOMAX) 0.4 MG CAPS capsule Take 0.4 mg by mouth daily after supper.   Yes [provider]  traMADol (ULTRAM) 50 MG tablet Take 50-100 mg by mouth every 12 (twelve) hours as needed for  moderate pain.    Yes [provider]  triamcinolone cream (KENALOG) 0.1 % Apply 1 application topically 2 (two) times daily. Rash on bottom   Yes [provider]  lisinopril (PRINIVIL,ZESTRIL) 10 MG tablet Take 10 mg by mouth daily.    [provider]    Family History No family history on file.  Social History Social History   Tobacco Use  . Smoking status: Never Smoker  . Smokeless tobacco: Never Used  Substance Use Topics  . Alcohol use: Never    Frequency: Never  . Drug use:  Never     Allergies   Nsaids   Review of Systems Review of Systems  Constitutional:       Per HPI, otherwise negative  HENT:       Per HPI, otherwise negative  Respiratory:       Per HPI, otherwise negative  Cardiovascular:       Per HPI, otherwise negative  Gastrointestinal: Positive for nausea. Negative for vomiting.  Endocrine:       Negative aside from HPI  Genitourinary:       Neg aside from HPI   Musculoskeletal:       Per HPI, otherwise negative  Skin: Positive for pallor.  Allergic/Immunologic: Positive for immunocompromised state.  Neurological: Positive for weakness and headaches. Negative for syncope.     Physical Exam Updated Vital Signs BP 135/67   Pulse 100   Temp 99.4 F (37.4 C) (Oral)   Resp 19   Ht 6\' 2"  (1.88 m)   Wt 120.2 kg   SpO2 98%   BMI 34.02 kg/m   Physical Exam  Constitutional: He is oriented to person, place, and time. He appears well-developed and well-nourished. No distress.  Nontoxic appearing and in no acute distress.  HENT:  Head: Normocephalic and atraumatic.  Nose: Nose normal.  Eyes: Pupils are equal, round, and reactive to light. Conjunctivae and EOM are normal. Right eye exhibits no discharge. Left eye exhibits no discharge. No scleral icterus.  Neck: Normal range of motion. Neck supple.  No meningismus.  Cardiovascular: Regular rhythm, normal heart sounds and intact distal pulses. Tachycardia present. Exam reveals no gallop and no friction rub.  No murmur heard. Pulmonary/Chest: Effort normal and breath sounds normal. No respiratory distress.  Abdominal: Soft. Bowel sounds are normal. He exhibits no distension. There is tenderness (Generalized). There is no guarding.  No CVA tenderness bilaterally.  Musculoskeletal: Normal range of motion. He exhibits no edema.  Trace pitting edema in bilateral lower extremities.  Dorsum of left foot appears edematous. L > R.  No calf tenderness or overlying erythema bilaterally.  Mild  erythema of the left great toe with no tenderness to palpation.  Neurological: He is alert and oriented to person, place, and time. No cranial nerve deficit or sensory deficit. He exhibits normal muscle tone. Coordination normal.  Pupils reactive. No facial asymmetry noted. Cranial nerves appear grossly intact. Sensation intact to light touch on face, BUE and BLE. Strength 5/5 in BUE and BLE.   Skin: Skin is warm and dry. No rash noted.  Psychiatric: He has a normal mood and affect.  Nursing note and vitals reviewed.    ED Treatments / Results  Labs (all labs ordered are listed, but only abnormal results are displayed) Labs Reviewed  COMPREHENSIVE METABOLIC PANEL - Abnormal; Notable for the following components:      Result Value   Glucose, Bld 155 (*)    All other components within  normal limits  CBC WITH DIFFERENTIAL/PLATELET - Abnormal; Notable for the following components:   RBC 4.21 (*)    Hemoglobin 12.3 (*)    HCT 37.3 (*)    Platelets 76 (*)    Neutro Abs 8.6 (*)    All other components within normal limits  URINALYSIS, COMPLETE (UACMP) WITH MICROSCOPIC - Abnormal; Notable for the following components:   Hgb urine dipstick MODERATE (*)    Ketones, ur TRACE (*)    Protein, ur TRACE (*)    Leukocytes, UA TRACE (*)    Bacteria, UA RARE (*)    All other components within normal limits  CBG MONITORING, ED - Abnormal; Notable for the following components:   Glucose-Capillary 127 (*)    All other components within normal limits  AMMONIA  LACTATE DEHYDROGENASE    EKG EKG Interpretation  Date/Time:  Monday February 28 2018 09:39:49 EDT Ventricular Rate:  105 PR Interval:  174 QRS Duration: 94 QT Interval:  322 QTC Calculation: 425 R Axis:   47 Text Interpretation:  Sinus tachycardia Minimal voltage criteria for LVH, may be normal variant Anterior injury pattern Abnormal ekg Confirmed by Carmin Muskrat [phone]) on 02/28/2018 10:33:01 AM   Radiology Dg Chest 2  View  Result Date: 02/27/2018 CLINICAL DATA:  Fever EXAM: CHEST - 2 VIEW COMPARISON:  None. FINDINGS: Right base atelectasis. Left lung clear. Heart is normal size. No effusions or acute bony abnormality. IMPRESSION: Right base atelectasis. Electronically Signed   By: Rolm Baptise M.D.   On: 02/27/2018 23:01   Dg Chest Port 1 View  Result Date: 02/28/2018 CLINICAL DATA:  67 year old male with fever, vomiting and weakness EXAM: PORTABLE CHEST 1 VIEW COMPARISON:  Prior chest x-ray 02/27/2018 FINDINGS: Cardiac and mediastinal contours remain unchanged. The lungs remain clear. No new airspace opacity, pleural effusion or pneumothorax. No evidence of pulmonary edema. Chronic bronchitic changes are similar. No acute osseous abnormality. IMPRESSION: Stable chest x-ray.  No acute cardiopulmonary process. Electronically Signed   By: Jacqulynn Cadet M.D.   On: 02/28/2018 08:59    Procedures Procedures (including critical care time)  Medications Ordered in ED Medications  sodium chloride 0.9 % bolus 1,000 mL (0 mLs Intravenous Stopped 02/28/18 1132)    And  0.9 %  sodium chloride infusion ( Intravenous Hold 02/28/18 1152)  HYDROmorphone (DILAUDID) injection 1 mg (1 mg Intravenous Given 02/28/18 0935)  HYDROmorphone (DILAUDID) injection 1 mg (1 mg Intravenous Given 02/28/18 0936)  ondansetron (ZOFRAN) injection 4 mg (4 mg Intravenous Given 02/28/18 0936)  sodium chloride 0.9 % bolus 1,000 mL (0 mLs Intravenous Stopped 02/28/18 1347)  prochlorperazine (COMPAZINE) injection 10 mg (10 mg Intravenous Given 02/28/18 1136)     Initial Impression / Assessment and Plan / ED Course  I have reviewed the triage vital signs and the nursing notes.  Pertinent labs & imaging results that were available during my care of the patient were reviewed by me and considered in my medical decision making (see chart for details).    After the initial evaluation I reviewed the patient's chart including results from yesterday,  with planned return for ultrasound this morning, which will be facilitated given the asymmetry of his lower extremities, liver dysfunction, concern for coagulopathy/DVT. Update: Patient remains nauseous, with headache after initial fluids, heart rate has diminished somewhat, but he has essentially the same symptoms.  Update:, Patient in similar condition.   2:19 PM Remaining labs back, similar to prior, with mild persistent anemia, improved creatinine, persistent hyperglycemia, no  leukocytosis. X-ray does not demonstrate pneumonia, urinalysis is generally reassuring aside from hematuria. However, the patient remains symptomatic, with low-grade fever, tachycardia, persistent headache and nausea. With these persistent symptoms, given the patient's immune compromised status, absence of recent use of immunosuppressants, I discussed his case with [facility] hospitalists, as that is where he received his transplant. We arranged for transfer to advanced care center for further evaluation and management.  Final Clinical Impressions(s) / ED Diagnoses   Final diagnoses:  Fever in adult     Carmin Muskrat, MD 02/28/18 1420

## 2018-02-28 NOTE — ED Notes (Signed)
Hospitalist at bedside 

## 2018-03-01 DIAGNOSIS — A419 Sepsis, unspecified organism: Secondary | ICD-10-CM | POA: Diagnosis not present

## 2018-03-01 DIAGNOSIS — I10 Essential (primary) hypertension: Secondary | ICD-10-CM | POA: Diagnosis not present

## 2018-03-01 DIAGNOSIS — E119 Type 2 diabetes mellitus without complications: Secondary | ICD-10-CM | POA: Diagnosis not present

## 2018-03-01 DIAGNOSIS — G9341 Metabolic encephalopathy: Secondary | ICD-10-CM | POA: Diagnosis not present

## 2018-03-01 LAB — PATHOLOGIST SMEAR REVIEW

## 2018-03-01 LAB — SAVE SMEAR

## 2018-03-01 LAB — HIV ANTIBODY (ROUTINE TESTING W REFLEX): HIV SCREEN 4TH GENERATION: NONREACTIVE

## 2018-03-01 NOTE — ED Notes (Signed)
Patient transferred to [facility]

## 2018-03-02 LAB — URINE CULTURE

## 2018-03-03 LAB — URINE CULTURE: CULTURE: NO GROWTH

## 2018-03-05 LAB — CULTURE, BLOOD (ROUTINE X 2)
CULTURE: NO GROWTH
CULTURE: NO GROWTH
Special Requests: ADEQUATE

## 2018-08-07 ENCOUNTER — Emergency Department (HOSPITAL_COMMUNITY): Payer: Medicare HMO

## 2018-08-07 ENCOUNTER — Encounter (HOSPITAL_COMMUNITY): Payer: Self-pay

## 2018-08-07 ENCOUNTER — Emergency Department (HOSPITAL_COMMUNITY)
Admission: EM | Admit: 2018-08-07 | Discharge: 2018-08-07 | Disposition: A | Discharge status: Home / Self Care | Payer: Medicare HMO | Attending: Emergency Medicine | Admitting: Emergency Medicine

## 2018-08-07 DIAGNOSIS — Z7982 Long term (current) use of aspirin: Secondary | ICD-10-CM | POA: Diagnosis not present

## 2018-08-07 DIAGNOSIS — R059 Cough, unspecified: Secondary | ICD-10-CM

## 2018-08-07 DIAGNOSIS — R509 Fever, unspecified: Secondary | ICD-10-CM | POA: Insufficient documentation

## 2018-08-07 DIAGNOSIS — I10 Essential (primary) hypertension: Secondary | ICD-10-CM | POA: Diagnosis not present

## 2018-08-07 DIAGNOSIS — R110 Nausea: Secondary | ICD-10-CM

## 2018-08-07 DIAGNOSIS — Z944 Liver transplant status: Secondary | ICD-10-CM | POA: Insufficient documentation

## 2018-08-07 DIAGNOSIS — J069 Acute upper respiratory infection, unspecified: Secondary | ICD-10-CM | POA: Diagnosis not present

## 2018-08-07 DIAGNOSIS — Z794 Long term (current) use of insulin: Secondary | ICD-10-CM | POA: Insufficient documentation

## 2018-08-07 DIAGNOSIS — J101 Influenza due to other identified influenza virus with other respiratory manifestations: Secondary | ICD-10-CM | POA: Insufficient documentation

## 2018-08-07 DIAGNOSIS — E119 Type 2 diabetes mellitus without complications: Secondary | ICD-10-CM | POA: Insufficient documentation

## 2018-08-07 DIAGNOSIS — R05 Cough: Secondary | ICD-10-CM

## 2018-08-07 LAB — CBC WITH DIFFERENTIAL/PLATELET
Abs Immature Granulocytes: 0.02 10*3/uL (ref 0.00–0.07)
BASOS ABS: 0 10*3/uL (ref 0.0–0.1)
BASOS PCT: 0 %
EOS ABS: 0 10*3/uL (ref 0.0–0.5)
Eosinophils Relative: 0 %
HCT: 40.6 % (ref 39.0–52.0)
Hemoglobin: 12.6 g/dL — ABNORMAL LOW (ref 13.0–17.0)
IMMATURE GRANULOCYTES: 0 %
Lymphocytes Relative: 5 %
Lymphs Abs: 0.3 10*3/uL — ABNORMAL LOW (ref 0.7–4.0)
MCH: 26.8 pg (ref 26.0–34.0)
MCHC: 31 g/dL (ref 30.0–36.0)
MCV: 86.4 fL (ref 80.0–100.0)
Monocytes Absolute: 0.4 10*3/uL (ref 0.1–1.0)
Monocytes Relative: 7 %
NEUTROS ABS: 4.6 10*3/uL (ref 1.7–7.7)
NEUTROS PCT: 88 %
PLATELETS: 92 10*3/uL — AB (ref 150–400)
RBC: 4.7 MIL/uL (ref 4.22–5.81)
RDW: 15.2 % (ref 11.5–15.5)
WBC: 5.3 10*3/uL (ref 4.0–10.5)
nRBC: 0 % (ref 0.0–0.2)

## 2018-08-07 LAB — BASIC METABOLIC PANEL
ANION GAP: 9 (ref 5–15)
BUN: 25 mg/dL — ABNORMAL HIGH (ref 8–23)
CALCIUM: 9.2 mg/dL (ref 8.9–10.3)
CO2: 22 mmol/L (ref 22–32)
Chloride: 106 mmol/L (ref 98–111)
Creatinine, Ser: 1.48 mg/dL — ABNORMAL HIGH (ref 0.61–1.24)
GFR calc Af Amer: 57 mL/min — ABNORMAL LOW (ref >60)
GFR calc non Af Amer: 49 mL/min — ABNORMAL LOW (ref >60)
GLUCOSE: 120 mg/dL — AB (ref 70–99)
Potassium: 4.3 mmol/L (ref 3.5–5.1)
SODIUM: 137 mmol/L (ref 135–145)

## 2018-08-07 LAB — LACTIC ACID, PLASMA: LACTIC ACID, VENOUS: 1 mmol/L (ref 0.5–1.9)

## 2018-08-07 LAB — INFLUENZA PANEL BY PCR (TYPE A & B)
INFLBPCR: NEGATIVE
Influenza A By PCR: POSITIVE — AB

## 2018-08-07 MED ORDER — ONDANSETRON 4 MG PO TBDP: 4.0000 mg | ORAL_TABLET | Freq: Three times a day (TID) | ORAL | 0 refills | Status: DC | PRN

## 2018-08-07 MED ORDER — OSELTAMIVIR PHOSPHATE 75 MG PO CAPS: 75.0000 mg | ORAL_CAPSULE | Freq: Two times a day (BID) | ORAL | 0 refills | Status: DC

## 2018-08-07 MED ORDER — OSELTAMIVIR PHOSPHATE 75 MG PO CAPS
75.0000 mg | ORAL_CAPSULE | Freq: Once | ORAL | Status: AC
  Administered 2018-08-07: 75 mg via ORAL
  Filled 2018-08-07: qty 1

## 2018-08-07 MED ORDER — SODIUM CHLORIDE 0.9 % IV BOLUS
1000.0000 mL | Freq: Once | INTRAVENOUS | Status: AC
  Administered 2018-08-07: 1000 mL via INTRAVENOUS

## 2018-08-07 MED ORDER — MORPHINE SULFATE (PF) 4 MG/ML IV SOLN
4.0000 mg | Freq: Once | INTRAVENOUS | Status: AC
  Administered 2018-08-07: 4 mg via INTRAVENOUS
  Filled 2018-08-07: qty 1

## 2018-08-07 MED ORDER — ONDANSETRON HCL 4 MG/2ML IJ SOLN
4.0000 mg | Freq: Once | INTRAMUSCULAR | Status: DC
  Filled 2018-08-07: qty 2

## 2018-08-07 NOTE — ED Provider Notes (Signed)
[facility] DEPT Provider Note   CSN: [id number] Arrival date & time: 08/07/18  2052     History   Chief Complaint Chief Complaint  Patient presents with  . Cough  . Fever    HPI Jonathan Perkins is a 68 y.o. male with a PMHx of DM2, HTN, GERD, liver transplant, and other conditions listed below, who presents to the ED with complaints of URI symptoms that began yesterday.  He reports having a moderate cough with brown sputum production, congestion, headaches, generalized weakness, nausea, and fever with Tmax 102.7.  He took Tylenol around 4 PM as well as Delsym but denies any improvement with these.  No known aggravating factors.  He did receive his flu shot this year.  His PCP is Doreatha Lew in [facility].  He denies any lightheadedness, dizziness, body aches, ear pain or drainage, sore throat, chest pain, shortness breath, abdominal pain, vomiting, diarrhea, constipation, dysuria, hematuria, myalgias, arthralgias, numbness, tingling, focal weakness, or any other complaints at this time.  The history is provided by the patient and medical records. No language interpreter was used.  Cough  Associated symptoms: fever and headaches   Associated symptoms: no chest pain, no ear pain, no myalgias, no shortness of breath and no sore throat   Fever  Associated symptoms: congestion, cough, headaches and nausea   Associated symptoms: no chest pain, no confusion, no diarrhea, no dysuria, no ear pain, no myalgias, no sore throat and no vomiting     Past Medical History:  Diagnosis Date  . Acid reflux   . Anxiety   . Depression   . Diabetes mellitus without complication ([place])   . Hypertension   . Liver transplant recipient [facility])   . Nerve pain   . Overactive bladder     Patient Active Problem List   Diagnosis Date Noted  . UTI (urinary tract infection) 02/28/2018  . Sepsis ([place]) 02/28/2018  . Acute metabolic encephalopathy 40/04/2724  . Thrombocytopenia  ([place]) 02/28/2018  . Leg swelling 02/28/2018  . Acid reflux   . Diabetes mellitus without complication ([place])   . Hypertension   . Liver transplant recipient [facility])     Past Surgical History:  Procedure Laterality Date  . CHOLECYSTECTOMY    . LIVER TRANSPLANT     2006, 2009  . TONSILLECTOMY    . TOTAL HIP ARTHROPLASTY  1997        Home Medications    Prior to Admission medications   Medication Sig Start Date End Date Taking? Authorizing Provider  aspirin EC 81 MG tablet Take 81 mg by mouth at bedtime.    [provider]  cloNIDine (CATAPRES) 0.2 MG tablet Take 0.2 mg by mouth 2 (two) times daily.    [provider]  cycloSPORINE modified (GENGRAF) 100 MG capsule Take 100 mg by mouth 2 (two) times daily.    [provider]  desonide (DESOWEN) 0.05 % cream Apply 1 application topically 2 (two) times daily.    [provider]  insulin glargine (LANTUS) 100 UNIT/ML injection Inject 0.4 mLs (40 Units total) into the skin at bedtime. Patient taking differently: Inject 40 Units into the skin daily with supper.  02/27/18   Khatri, Hina, PA-C  ketoconazole (NIZORAL) 2 % cream Apply 1 application topically daily. Rash on bottom    [provider]  lisinopril (PRINIVIL,ZESTRIL) 10 MG tablet Take 10 mg by mouth daily.    [provider]  metoprolol tartrate (LOPRESSOR) 50 MG tablet  Take 50 mg by mouth 2 (two) times daily.     [provider]  metroNIDAZOLE (METROGEL) 1 % gel Apply 1 application topically daily as needed (rosacea).     [provider]  pantoprazole (PROTONIX) 40 MG tablet Take 40 mg by mouth daily after breakfast.    [provider]  pravastatin (PRAVACHOL) 10 MG tablet Take 10 mg by mouth at bedtime.    [provider]  pregabalin (LYRICA) 50 MG capsule Take 50 mg by mouth 2 (two) times daily.     [provider]  tamsulosin (FLOMAX) 0.4 MG CAPS capsule Take 0.4 mg by mouth daily  after supper.    [provider]  traMADol (ULTRAM) 50 MG tablet Take 50-100 mg by mouth every 12 (twelve) hours as needed for moderate pain.     [provider]  triamcinolone cream (KENALOG) 0.1 % Apply 1 application topically 2 (two) times daily. Rash on bottom    [provider]    Family History Family History  Problem Relation Age of Onset  . Pulmonary fibrosis Father   . Heart attack Father     Social History Social History   Tobacco Use  . Smoking status: Never Smoker  . Smokeless tobacco: Never Used  Substance Use Topics  . Alcohol use: Never    Frequency: Never  . Drug use: Never     Allergies   Nsaids   Review of Systems Review of Systems  Constitutional: Positive for fatigue (generalized weakness) and fever.  HENT: Positive for congestion. Negative for ear discharge, ear pain and sore throat.   Respiratory: Positive for cough. Negative for shortness of breath.   Cardiovascular: Negative for chest pain.  Gastrointestinal: Positive for nausea. Negative for abdominal pain, constipation, diarrhea and vomiting.  Genitourinary: Negative for dysuria and hematuria.  Musculoskeletal: Negative for arthralgias and myalgias.  Skin: Negative for color change.  Allergic/Immunologic: Positive for immunocompromised state (liver transplant and DM2).  Neurological: Positive for headaches. Negative for dizziness, weakness, light-headedness and numbness.  Psychiatric/Behavioral: Negative for confusion.   All other systems reviewed and are negative for acute change except as noted in the HPI.    Physical Exam Updated Vital Signs BP 109/72 (BP Location: Right Arm)   Pulse 98   Temp 99.7 F (37.6 C) (Oral)   Resp 20   SpO2 96%   Physical Exam Vitals signs and nursing note reviewed.  Constitutional:      General: He is not in acute distress.    Appearance: Normal appearance. He is well-developed. He is not toxic-appearing.     Comments:  Afebrile, nontoxic, NAD  HENT:     Head: Normocephalic and atraumatic.     Nose: Congestion and rhinorrhea present.     Right Sinus: Maxillary sinus tenderness and frontal sinus tenderness present.     Left Sinus: Frontal sinus tenderness present.     Comments: Mildly congested and with slight rhinorrhea Diffuse frontal and R maxillary sinus TTP    Mouth/Throat:     Pharynx: Oropharynx is clear. Uvula midline. No pharyngeal swelling, oropharyngeal exudate, posterior oropharyngeal erythema or uvula swelling.     Tonsils: No tonsillar exudate or tonsillar abscesses.     Comments: Oropharynx clear and moist, without uvular swelling or deviation, no trismus or drooling, no tonsillar swelling or erythema, no exudates.   Eyes:     General:        Right eye: No discharge.  Left eye: No discharge.     Conjunctiva/sclera: Conjunctivae normal.     Pupils: Pupils are equal, round, and reactive to light.     Comments: PERRL, EOMI, no nystagmus  Neck:     Musculoskeletal: Normal range of motion and neck supple.     Comments: No meningismus Cardiovascular:     Rate and Rhythm: Normal rate and regular rhythm.     Pulses: Normal pulses.     Heart sounds: Normal heart sounds, S1 normal and S2 normal. No murmur. No friction rub. No gallop.   Pulmonary:     Effort: Pulmonary effort is normal. No respiratory distress.     Breath sounds: Normal breath sounds. No decreased breath sounds, wheezing, rhonchi or rales.     Comments: CTAB in all lung fields, no w/r/r, no hypoxia or increased WOB, speaking in full sentences, SpO2 99% on RA  Abdominal:     General: Bowel sounds are normal. There is no distension.     Palpations: Abdomen is soft. Abdomen is not rigid.     Tenderness: There is no abdominal tenderness. There is no right CVA tenderness, left CVA tenderness, guarding or rebound. Negative signs include Murphy's sign and McBurney's sign.     Comments: Soft, NTND, +BS throughout, no r/g/r, neg  murphy's, neg mcburney's, no CVA TTP   Musculoskeletal: Normal range of motion.  Skin:    General: Skin is warm and dry.     Findings: No rash.  Neurological:     Mental Status: He is alert and oriented to person, place, and time.     GCS: GCS eye subscore is 4. GCS verbal subscore is 5. GCS motor subscore is 6.     Cranial Nerves: Cranial nerves are intact.     Sensory: Sensation is intact. No sensory deficit.     Motor: Motor function is intact.     Coordination: Coordination is intact.     Comments: CN 2-12 grossly intact A&O x4 GCS 15 Sensation and strength intact Coordination with finger-to-nose WNL  Psychiatric:        Mood and Affect: Mood and affect normal.        Behavior: Behavior normal.      ED Treatments / Results  Labs (all labs ordered are listed, but only abnormal results are displayed) Labs Reviewed  CBC WITH DIFFERENTIAL/PLATELET - Abnormal; Notable for the following components:      Result Value   Hemoglobin 12.6 (*)    Platelets 92 (*)    Lymphs Abs 0.3 (*)    All other components within normal limits  BASIC METABOLIC PANEL - Abnormal; Notable for the following components:   Glucose, Bld 120 (*)    BUN 25 (*)    Creatinine, Ser 1.48 (*)    GFR calc non Af Amer 49 (*)    GFR calc Af Amer 57 (*)    All other components within normal limits  INFLUENZA PANEL BY PCR (TYPE A & B) - Abnormal; Notable for the following components:   Influenza A By PCR POSITIVE (*)    All other components within normal limits  CULTURE, BLOOD (ROUTINE X 2)  CULTURE, BLOOD (ROUTINE X 2)  LACTIC ACID, PLASMA    EKG None  Radiology Dg Chest 2 View  Result Date: 08/07/2018 CLINICAL DATA:  Headache, fever, and nonproductive cough for 3 days. EXAM: CHEST - 2 VIEW COMPARISON:  02/28/2018 FINDINGS: Slightly shallow inspiration with elevation of the right hemidiaphragm. Heart size and pulmonary  vascularity are normal. No airspace disease or consolidation in the lungs. No  blunting of costophrenic angles. No pneumothorax. Mediastinal contours appear intact. Calcification of the aorta. Degenerative changes in the spine. IMPRESSION: No active cardiopulmonary disease. Electronically Signed   By: Lucienne Capers M.D.   On: 08/07/2018 21:33    Procedures Procedures (including critical care time)  Medications Ordered in ED Medications  ondansetron [facility]) injection 4 mg (4 mg Intravenous Not Given 08/07/18 2140)  sodium chloride 0.9 % bolus 1,000 mL (0 mLs Intravenous Stopped 08/07/18 2234)  morphine 4 MG/ML injection 4 mg (4 mg Intravenous Given 08/07/18 2213)  oseltamivir (TAMIFLU) capsule 75 mg (75 mg Oral Given 08/07/18 2238)     Initial Impression / Assessment and Plan / ED Course  I have reviewed the triage vital signs and the nursing notes.  Pertinent labs & imaging results that were available during my care of the patient were reviewed by me and considered in my medical decision making (see chart for details).     68 y.o. male with URI symptoms x1 day, pt with hx of liver transplant. On exam, mild nasal congestion and rhinorrhea, sinus TTP, clear throat, no meningismus, no focal neuro deficits, clear lung exam, no abdominal tenderness, low grade temp 99.7, no tachycardia, BP stable. Does not appear septic nor does he meet SIRS/sepsis criteria at this time. Suspect URI/sinusitis as the source of his symptoms. Will get labs, flu swab, and CXR, give fluids, morphine, and zofran, and reassess shortly. Discussed case with my attending Dr. Zenia Resides who agrees with plan.  10:40 PM CBC w/diff with mild anemia and thrombocytopenia which are both similar to prior values. BMP with mildly elevated Cr 1.48 somewhat similar to prior values. Lactic WNL. Flu test positive for influenza A. CXR negative for PNA or other acute findings. U/A cancelled since we have a source of his fever and without UTI symptoms.  Pt feeling significantly better and wants to go home, which I think is  reasonable. First dose of tamiflu given here, will d/c home with tamiflu and zofran. Advised staying very well hydrated, alternate tylenol/motrin for pain/fever, and use other OTC remedies for symptomatic relief. F/up with PCP in 3-5 days for recheck. Strict return precautions advised. I explained the diagnosis and have given explicit precautions to return to the ER including for any other new or worsening symptoms. The patient understands and accepts the medical plan as it's been dictated and I have answered their questions. Discharge instructions concerning home care and prescriptions have been given. The patient is STABLE and is discharged to home in good condition.    Final Clinical Impressions(s) / ED Diagnoses   Final diagnoses:  Influenza A  Fever, unspecified fever cause  Cough  Upper respiratory tract infection, unspecified type  Nausea    ED Discharge Orders         Ordered    oseltamivir (TAMIFLU) 75 MG capsule  Every 12 hours     08/07/18 2239    ondansetron (ZOFRAN ODT) 4 MG disintegrating tablet  Every 8 hours PRN     08/07/18 [address], [place], [place] 08/07/18 2241    Lacretia Leigh, MD 08/08/18 1348

## 2018-08-07 NOTE — Discharge Instructions (Addendum)
You have the flu, which explains the symptoms you're having. Take tamiflu as directed. TAMIFLU DOES NOT CURE THE FLU! Take on a full stomach as it can cause some nausea. Use zofran as directed as needed for nausea, take 30 minutes before taking tamiflu in order to lessen the nausea from tamiflu.  Continue to stay well-hydrated. Continue to alternate between Tylenol and Ibuprofen for pain or fever. Use Mucinex/Robitussin/etc for cough suppression/expectoration of mucus. Use over the counter flonase and the netipot to help with nasal congestion. May consider over-the-counter Benadryl or other antihistamine like Claritin/Zyrtec/etc to decrease secretions and for help with your symptoms. Follow up with your primary care doctor in 3-5 days for recheck of ongoing symptoms. Return to emergency department for emergent changing or worsening of symptoms.

## 2018-08-07 NOTE — ED Notes (Signed)
Bed: UV25 Expected date:  Expected time:  Means of arrival:  Comments: EMS 68 yo male from home upper resp congestion fever and headache temp 102 124/78/liver transplant patient

## 2018-08-07 NOTE — ED Triage Notes (Signed)
Pt arrived via [facility] due to a productive cough the last 2 days. Pt endorses facial pain and congestion. Pt states fever spiked today. Last dose of tylenol at 5pm. Pt has a history of liver transplant.

## 2018-08-07 NOTE — ED Notes (Signed)
Patient transported to X-ray 

## 2018-08-13 LAB — CULTURE, BLOOD (ROUTINE X 2)
Culture: NO GROWTH
Culture: NO GROWTH
Special Requests: ADEQUATE

## 2019-10-03 ENCOUNTER — Other Ambulatory Visit: Payer: Self-pay | Admitting: Nephrology

## 2019-10-03 DIAGNOSIS — N1831 Chronic kidney disease, stage 3a: Secondary | ICD-10-CM

## 2019-10-04 ENCOUNTER — Ambulatory Visit
Admission: RE | Admit: 2019-10-04 | Discharge: 2019-10-04 | Disposition: A | Discharge status: Home / Self Care | Payer: Medicare HMO | Source: Ambulatory Visit | Attending: Nephrology | Admitting: Nephrology

## 2019-10-04 DIAGNOSIS — N1831 Chronic kidney disease, stage 3a: Secondary | ICD-10-CM

## 2020-06-06 ENCOUNTER — Other Ambulatory Visit: Payer: Medicare HMO

## 2020-06-06 DIAGNOSIS — Z20822 Contact with and (suspected) exposure to covid-19: Secondary | ICD-10-CM

## 2020-06-07 LAB — NOVEL CORONAVIRUS, NAA: SARS-CoV-2, NAA: NOT DETECTED

## 2020-06-07 LAB — SARS-COV-2, NAA 2 DAY TAT

## 2020-08-22 ENCOUNTER — Telehealth: Payer: Self-pay | Admitting: Gastroenterology

## 2020-08-22 NOTE — Telephone Encounter (Signed)
Called and left a voicemail for Jonathan Perkins to call back. We do participate with his insurance [employer]. Will try again before I leave for the day.

## 2020-08-22 NOTE — Telephone Encounter (Signed)
Will this be in the [facility] or hospital?

## 2020-08-22 NOTE — Telephone Encounter (Signed)
Barbie Haggis here is another referral that I need help with thank you.  He will need an [facility] colon, previsit.  Can you put in the previsit appt notes he will need 2 day prep.  Thank you

## 2020-08-22 NOTE — Telephone Encounter (Signed)
I was called by [facility] hepatology transplant team.  Dr. Willadean Carol and Dr. Loletha Grayer.  Care everywhere records were reviewed.  He is status post liver transplant years ago and remains on immunosuppression.  He lives in [place].  He has no current gastroenterologist in [place] however and gets all of his care through [place].  However, he has been having some issues with changes in his bowel habits and constipation.  He also has anemia. The team would like to see if we can see the patient for consideration of endoscopic evaluation.  Patty, please reach out to the patient and let him know that we are happy to be available for him.  Please ensure his insurance is accepted by our group.  If it is then go ahead and move forward with scheduling the patient a previsit for a 2-day colonoscopy preparation due to his constipation issues that are being documented.  Colonoscopy and endoscopy to be scheduled together (evaluation of changes in bowel habits as well as chronic anemia and chronic GERD to rule out Barrett's esophagus).  If the patient is not able to have procedures done through [place] due to insurance issues, please let me know so I can update the [facility] transplant hepatology team.  Thank you. GM

## 2020-08-22 NOTE — Telephone Encounter (Signed)
[facility].

## 2020-08-23 ENCOUNTER — Encounter: Payer: Self-pay | Admitting: Gastroenterology

## 2020-10-22 ENCOUNTER — Ambulatory Visit (AMBULATORY_SURGERY_CENTER): Payer: Self-pay

## 2020-10-22 ENCOUNTER — Other Ambulatory Visit: Payer: Self-pay

## 2020-10-22 ENCOUNTER — Telehealth: Payer: Self-pay

## 2020-10-22 VITALS — Ht 73.0 in | Wt 261.0 lb

## 2020-10-22 DIAGNOSIS — D638 Anemia in other chronic diseases classified elsewhere: Secondary | ICD-10-CM

## 2020-10-22 DIAGNOSIS — K219 Gastro-esophageal reflux disease without esophagitis: Secondary | ICD-10-CM

## 2020-10-22 DIAGNOSIS — R194 Change in bowel habit: Secondary | ICD-10-CM

## 2020-10-22 DIAGNOSIS — K5900 Constipation, unspecified: Secondary | ICD-10-CM

## 2020-10-22 NOTE — Telephone Encounter (Signed)
Pt had previsit today and said he had a lab test last week and his K level was "high", states he was to take some medication and then have his labs done again today.  I made his prep a 2 day miralax/miralax since I wasn't sure if the suprep would do something with his potassium levels and he is going to talk to his transplant coordinator at [facility] to just make sure he is ok to prep for 2 days if his K level is not normal yet.  States he will call us and let us know.  His endocolon is sched on Tuesday 11/05/20.  Please advise if there is anything else I can do, thanks, Westley Hummer

## 2020-10-22 NOTE — Progress Notes (Signed)
No egg or soy allergy known to patient  No issues with past sedation with any surgeries or procedures Patient denies ever being told they had issues or difficulty with intubation  No FH of Malignant Hyperthermia No diet pills per patient No home 02 use per patient  No blood thinners per patient    Pt has  issues with constipation, 2 day prep ordered  No A fib or A flutter    EMMI video to pt or via [place] 19 guidelines implemented in PV today with Pt and RN  Pt is fully vaccinated  for Covid   NO PA's for preps discussed with pt In PV today  Discussed with pt there will be an out-of-pocket cost for prep and that varies from $0 to 70 dollars   Due to the COVID-19 pandemic we are asking patients to follow certain guidelines.  Pt aware of COVID protocols and [facility] guidelines

## 2020-10-23 ENCOUNTER — Other Ambulatory Visit: Payer: Self-pay

## 2020-10-23 ENCOUNTER — Ambulatory Visit: Payer: Medicare HMO | Attending: Family Medicine | Admitting: Audiologist

## 2020-10-23 DIAGNOSIS — H903 Sensorineural hearing loss, bilateral: Secondary | ICD-10-CM | POA: Insufficient documentation

## 2020-10-23 NOTE — Procedures (Signed)
  Outpatient Audiology and [place] [facility], [place]  [id number] [phone]  AUDIOLOGICAL  EVALUATION  NAME: Jonathan Perkins     DOB:   1950/12/13      MRN: [id number]                                                                                     DATE: 10/23/2020     REFERENT: Shanon Ace, MD STATUS: Outpatient DIAGNOSIS: Sensory hearing loss, bilateral    History: [name] was seen for an audiological evaluation.  [name] is receiving a hearing evaluation due to concerns for hearing loss in both ears. [name] has difficulty hearing in background noise, crowds, and when people are at a distance. 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 This difficulty began gradually. No pain or pressure reported in either ear. No tinnitus present in either ear. 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  Medical history negative for a condition which is a risk factor for hearing loss. No other relevant case history reported.   Evaluation:   Otoscopy showed a clear view of the tympanic membranes, bilaterally  Tympanometry results were consistent with normal middle ear function, bilaterally    Audiometric testing was completed using conventional audiometry with insert transducer. Speech Recognition Thresholds were consistent with pure tone averages. Word Recognition was excellent at an elevated level. Pure tone thresholds show normal sloping to moderately severe sensorineural hearing loss in both ears. 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   Results:  The test results were reviewed with [name]. he  was counseled on the nature and degree of his hearing loss. [name] was provided with several copies of his audiogram that illustrate his degree of hearing loss in both ears. [name]'s hearing loss is in the high frequencies preventing [name] from hearing high frequency consonants such as /s/ /sh/ /f/ /t/ and  /th/. These sounds help differentiate the words he  hears. Without these sounds, speech is muffled and unclear unless someone is face to face within 5 feet without a mask. [name] is a hearing aid candidate. With hearing aids the clarity of speech will improve and [name] will not have to work so hard to hear. [name] was provided with a list of audiologists that dispense hearing aids. He was advised that this audiogram can be used to program a hearing aid for the next six months, after which he would need his hearing retested.  Recommendations: 1. Amplification is necessary for both ears. Hearing aids can be purchased from a variety of locations. See provided list for locations in the [place] area.  2. Recommend audiologic evaluation annually due to difference in speech understanding between the ears at conversational level.    Alfonse Alpers  Audiologist, Au.D., CCC-A 10/23/2020  2:43 PM  Cc: Shanon Ace, MD

## 2020-10-24 NOTE — Telephone Encounter (Signed)
Thank you for the update. I am fine with the 2-day preparation.  We do need to just ensure that his potassium level is less than 5.5 before our procedures.  Probably best for [place] to get a BMP the Friday before or the Monday before to ensure safety? Thanks. GM

## 2020-10-25 NOTE — Telephone Encounter (Signed)
Lab order entered and pt advised.

## 2020-10-25 NOTE — Telephone Encounter (Signed)
Per Dr. Rush Landmark response, this pt needs a BMP the Friday 4/29 before his procedure on Tue  5/3 to ensure his K is 5.5 or less.  Thank you for your help.

## 2020-11-04 ENCOUNTER — Telehealth: Payer: Self-pay

## 2020-11-04 NOTE — Telephone Encounter (Signed)
The pt has been advised that he can proceed with procedure as planned.  He agrees to keep appt for tomorrow.

## 2020-11-04 NOTE — Telephone Encounter (Signed)
Spoke with the pt and he tells me that he went for labs at [facility].  He is going to have them send a copy of results to our office for review. As mentioned he states K was 4.8 on 4/20.  Lab was ordered by [facility].  He did come to our office for the repeat lab because he tells me he had labs at [facility] was not going to have any more labs completed.  He is very upset that he has received calls today.  I tired to reassure the pt and explain that we can not request the labs since he did not use our facility or order.  He did agree to try and have [facility] send [place] the lab report.

## 2020-11-04 NOTE — Telephone Encounter (Signed)
Thanks Patty! 

## 2020-11-05 ENCOUNTER — Ambulatory Visit (AMBULATORY_SURGERY_CENTER): Payer: Medicare HMO | Admitting: Gastroenterology

## 2020-11-05 ENCOUNTER — Other Ambulatory Visit: Payer: Self-pay

## 2020-11-05 ENCOUNTER — Encounter: Payer: Self-pay | Admitting: Gastroenterology

## 2020-11-05 VITALS — BP 143/89 | HR 83 | Temp 99.1°F | Resp 16 | Ht 73.0 in | Wt 261.0 lb

## 2020-11-05 DIAGNOSIS — D122 Benign neoplasm of ascending colon: Secondary | ICD-10-CM | POA: Diagnosis not present

## 2020-11-05 DIAGNOSIS — K222 Esophageal obstruction: Secondary | ICD-10-CM | POA: Diagnosis not present

## 2020-11-05 DIAGNOSIS — K2970 Gastritis, unspecified, without bleeding: Secondary | ICD-10-CM

## 2020-11-05 DIAGNOSIS — K219 Gastro-esophageal reflux disease without esophagitis: Secondary | ICD-10-CM | POA: Diagnosis not present

## 2020-11-05 DIAGNOSIS — K2950 Unspecified chronic gastritis without bleeding: Secondary | ICD-10-CM

## 2020-11-05 DIAGNOSIS — D120 Benign neoplasm of cecum: Secondary | ICD-10-CM | POA: Diagnosis not present

## 2020-11-05 DIAGNOSIS — R194 Change in bowel habit: Secondary | ICD-10-CM

## 2020-11-05 DIAGNOSIS — D127 Benign neoplasm of rectosigmoid junction: Secondary | ICD-10-CM | POA: Diagnosis not present

## 2020-11-05 DIAGNOSIS — K317 Polyp of stomach and duodenum: Secondary | ICD-10-CM

## 2020-11-05 DIAGNOSIS — K5730 Diverticulosis of large intestine without perforation or abscess without bleeding: Secondary | ICD-10-CM

## 2020-11-05 DIAGNOSIS — K5900 Constipation, unspecified: Secondary | ICD-10-CM

## 2020-11-05 DIAGNOSIS — D123 Benign neoplasm of transverse colon: Secondary | ICD-10-CM | POA: Diagnosis not present

## 2020-11-05 DIAGNOSIS — D649 Anemia, unspecified: Secondary | ICD-10-CM

## 2020-11-05 DIAGNOSIS — K641 Second degree hemorrhoids: Secondary | ICD-10-CM

## 2020-11-05 MED ORDER — SODIUM CHLORIDE 0.9 % IV SOLN: 500.0000 mL | INTRAVENOUS | Status: DC

## 2020-11-05 MED ORDER — PANTOPRAZOLE SODIUM 40 MG PO TBEC: 40.0000 mg | DELAYED_RELEASE_TABLET | Freq: Every day | ORAL | 3 refills | Status: DC

## 2020-11-05 MED ORDER — PANTOPRAZOLE SODIUM 20 MG PO TBEC: 20.0000 mg | DELAYED_RELEASE_TABLET | Freq: Every day | ORAL | 3 refills | Status: DC

## 2020-11-05 NOTE — Patient Instructions (Signed)
Handouts given:  Gastritis, Hiatal Hernia, Polyps, High Fiber diet  Increase protonix to 20mg  twice daily or 40mg  tab once daily may repeat EGD to to assess if gastritis is healed Start High Fiber Diet Use Fibercon 1-2 tablets daily Await pathology results YOU HAD AN ENDOSCOPIC PROCEDURE TODAY AT [place]:   Refer to the procedure report that was given to you for any specific questions about what was found during the examination.  If the procedure report does not answer your questions, please call your gastroenterologist to clarify.  If you requested that your care partner not be given the details of your procedure findings, then the procedure report has been included in a sealed envelope for you to review at your convenience later.  YOU SHOULD EXPECT: Some feelings of bloating in the abdomen. Passage of more gas than usual.  Walking can help get rid of the air that was put into your GI tract during the procedure and reduce the bloating. If you had a lower endoscopy (such as a colonoscopy or flexible sigmoidoscopy) you may notice spotting of blood in your stool or on the toilet paper. If you underwent a bowel prep for your procedure, you may not have a normal bowel movement for a few days.  Please Note:  You might notice some irritation and congestion in your nose or some drainage.  This is from the oxygen used during your procedure.  There is no need for concern and it should clear up in a day or so.  SYMPTOMS TO REPORT IMMEDIATELY:   Following lower endoscopy (colonoscopy or flexible sigmoidoscopy):  Excessive amounts of blood in the stool  Significant tenderness or worsening of abdominal pains  Swelling of the abdomen that is new, acute  Fever of 100F or higher   Following upper endoscopy (EGD)  Vomiting of blood or coffee ground material  New chest pain or pain under the shoulder blades  Painful or persistently difficult swallowing  New shortness of breath  Fever  of 100F or higher  Black, tarry-looking stools  For urgent or emergent issues, a gastroenterologist can be reached at any hour by calling [phone]. Do not use MyChart messaging for urgent concerns.    DIET:  We do recommend a small meal at first, but then you may proceed to your regular diet.  Drink plenty of fluids but you should avoid alcoholic beverages for 24 hours.  ACTIVITY:  You should plan to take it easy for the rest of today and you should NOT DRIVE or use heavy machinery until tomorrow (because of the sedation medicines used during the test).    FOLLOW UP: Our staff will call the number listed on your records 48-72 hours following your procedure to check on you and address any questions or concerns that you may have regarding the information given to you following your procedure. If we do not reach you, we will leave a message.  We will attempt to reach you two times.  During this call, we will ask if you have developed any symptoms of COVID 19. If you develop any symptoms (ie: fever, flu-like symptoms, shortness of breath, cough etc.) before then, please call [phone].  If you test positive for Covid 19 in the 2 weeks post procedure, please call and report this information to [place].    If any biopsies were taken you will be contacted by phone or by letter within the next 1-3 weeks.  Please call us at [phone] if  you have not heard about the biopsies in 3 weeks.    SIGNATURES/CONFIDENTIALITY: You and/or your care partner have signed paperwork which will be entered into your electronic medical record.  These signatures attest to the fact that that the information above on your After Visit Summary has been reviewed and is understood.  Full responsibility of the confidentiality of this discharge information lies with you and/or your care-partner.

## 2020-11-05 NOTE — Op Note (Addendum)
[facility] Patient Name: Jonathan Perkins Procedure Date: 11/05/2020 9:44 AM MRN: [id number] Endoscopist: Justice Britain , MD Age: 70 Referring MD:  Date of Birth: 1951/06/22 Gender: Male Account #: [id number] Procedure:                Upper GI endoscopy Indications:              Heartburn, Gastro-esophageal reflux disease,                            Screening for Barrett's esophagus in patient at                            risk for this condition Medicines:                Monitored Anesthesia Care Procedure:                Pre-Anesthesia Assessment:                           - Prior to the procedure, a History and Physical                            was performed, and patient medications and                            allergies were reviewed. The patient's tolerance of                            previous anesthesia was also reviewed. The risks                            and benefits of the procedure and the sedation                            options and risks were discussed with the patient.                            All questions were answered, and informed consent                            was obtained. Prior Anticoagulants: The patient has                            taken no previous anticoagulant or antiplatelet                            agents. ASA Grade Assessment: II - A patient with                            mild systemic disease. After reviewing the risks                            and benefits, the patient was deemed in  satisfactory condition to undergo the procedure.                           After obtaining informed consent, the endoscope was                            passed under direct vision. Throughout the                            procedure, the patient's blood pressure, pulse, and                            oxygen saturations were monitored continuously. The                            Endoscope was introduced through the  mouth, and                            advanced to the second part of duodenum. The upper                            GI endoscopy was accomplished without difficulty.                            The patient tolerated the procedure. Scope In: Scope Out: Findings:                 No gross lesions were noted in the entire esophagus.                           The Z-line was regular and was found 44 cm from the                            incisors.                           A non-obstructing Schatzki ring was found at the                            gastroesophageal junction.                           A 3 cm hiatal hernia was present.                           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 Multiple of these were                            taken with a cold forceps for histology.                           A single 12 mm sessile polyp with no bleeding and                            no stigmata of recent bleeding was found in the                            gastric antrum. The polyp was removed with a cold                            snare. Resection and retrieval were complete. To                            prevent bleeding after the polypectomy, one                            hemostatic clip was successfully placed (MR                            conditional). There was no bleeding at the end of                            the procedure.                           Segmental and patchy moderate inflammation                            characterized by erythema, friability and                            granularity was found in the gastric body, in the                            gastric antrum and in the prepyloric region of the                            stomach (in the antrum  and prepylorus the                            inflammation was striped - query developing GAVE?).                           No other gross lesions were noted in the entire                            examined stomach. Biopsies were taken with a cold  forceps for histology and Helicobacter pylori                            testing.                           No gross lesions were noted in the duodenal bulb,                            in the first portion of the duodenum and in the                            second portion of the duodenum. Complications:            No immediate complications. Estimated Blood Loss:     Estimated blood loss was minimal. Impression:               - No gross lesions in esophagus. Z-line regular, 44                            cm from the incisors.                           - Non-obstructing Schatzki ring.                           - 3 cm hiatal hernia.                           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 A                            few were removed for biopsy.                           - A single gastric polyp in antrum. Resected and                            retrieved. Clip (MR conditional) was placed.                           - Gastritis in body and antrum. No other gross                            lesions in the stomach. Biopsied.                           - No gross lesions in the duodenal bulb, in the                            first portion of the duodenum and in the second                            portion of the duodenum. Recommendation:           - Proceed to  scheduled colonoscopy.                           - Increase Protonix to 20 mg twice daily or 40 mg                            daily and maintain.                           - Consideration of repeat EGD to see if gastritis                            has improved (or if still present could this be                            GAVE of the antrum that  could require additional                            therapies). Reasonable to discuss in follow up                            clinic, should patient wish.                           - The findings and recommendations were discussed                            with the patient.                           - The findings and recommendations were discussed                            with the patient's family. Justice Britain, MD 11/05/2020 10:42:31 AM

## 2020-11-05 NOTE — Op Note (Signed)
[place] Patient Name: Jonathan Perkins Procedure Date: 11/05/2020 9:43 AM MRN: [id number] Endoscopist: Justice Britain , MD Age: 70 Referring MD:  Date of Birth: 1951-03-03 Gender: Male Account #: [id number] Procedure:                Colonoscopy Indications:              Screening for colorectal malignant neoplasm,                            Incidental change in bowel habits noted, Incidental                            constipation noted Medicines:                Monitored Anesthesia Care Procedure:                Pre-Anesthesia Assessment:                           - Prior to the procedure, a History and Physical                            was performed, and patient medications and                            allergies were reviewed. The patient's tolerance of                            previous anesthesia was also reviewed. The risks                            and benefits of the procedure and the sedation                            options and risks were discussed with the patient.                            All questions were answered, and informed consent                            was obtained. Prior Anticoagulants: The patient has                            taken no previous anticoagulant or antiplatelet                            agents. ASA Grade Assessment: II - A patient with                            mild systemic disease. After reviewing the risks                            and benefits, the patient was deemed in  satisfactory condition to undergo the procedure.                           After obtaining informed consent, the colonoscope                            was passed under direct vision. Throughout the                            procedure, the patient's blood pressure, pulse, and                            oxygen saturations were monitored continuously. The                            Olympus CF-HQ190L (Serial# [id number]) Colonoscope  was                            introduced through the anus and advanced to the the                            cecum, identified by appendiceal orifice and                            ileocecal valve. The patient tolerated the                            procedure. The quality of the bowel preparation was                            adequate. The ileocecal valve, appendiceal orifice,                            and rectum were photographed. The colonoscopy was                            somewhat difficult due to significant looping.                            Successful completion of the procedure was aided by                            changing the patient's position, using manual                            pressure, straightening and shortening the scope to                            obtain bowel loop reduction and using scope torsion. Scope In: 10:04:37 AM Scope Out: 10:29:05 AM Scope Withdrawal Time: 0 hours 18 minutes 7 seconds  Total Procedure Duration: 0 hours 24 minutes 28 seconds  Findings:                 The digital rectal exam findings include  hemorrhoids. Pertinent negatives include no                            palpable rectal lesions.                           The colon (entire examined portion) revealed                            significantly excessive looping.                           Six sessile polyps were found in the recto-sigmoid                            colon (1), splenic flexure (1), transverse colon                            (2), ascending colon (1) and cecum (1). The polyps                            were 3 to 10 mm in size. These polyps were removed                            with a cold snare. Resection and retrieval were                            complete.                           Multiple small-mouthed diverticula were found in                            the recto-sigmoid colon and sigmoid colon.                           Normal  mucosa was found in the entire colon                            otherwise.                           Non-bleeding non-thrombosed internal hemorrhoids                            were found during retroflexion, during perianal                            exam and during digital exam. The hemorrhoids were                            Grade II (internal hemorrhoids that prolapse but                            reduce spontaneously). Complications:  No immediate complications. Estimated Blood Loss:     Estimated blood loss was minimal. Impression:               - Hemorrhoids found on digital rectal exam.                           - There was significant looping of the colon.                           - Six 3 to 10 mm polyps at the recto-sigmoid colon,                            at the splenic flexure, in the transverse colon, in                            the ascending colon and in the cecum, removed with                            a cold snare. Resected and retrieved.                           - Diverticulosis in the recto-sigmoid colon and in                            the sigmoid colon.                           - Normal mucosa in the entire examined colon                            otherwise.                           - Non-bleeding non-thrombosed internal hemorrhoids. Recommendation:           - The patient will be observed post-procedure,                            until all discharge criteria are met.                           - Discharge patient to home.                           - Patient has a contact number available for                            emergencies. The signs and symptoms of potential                            delayed complications were discussed with the                            patient. Return to normal activities tomorrow.  Written discharge instructions were provided to the                            patient.                            - High fiber diet.                           - Use FiberCon 1-2 tablets PO daily.                           - Continue present medications.                           - Await pathology results.                           - Repeat colonoscopy in 3/5/7 years for                            surveillance based on pathology results.                           - If constipation remains may want to consider                            daily Miralax vs other medications                            (Trulance/Linzess/Amitiza) in future.                           - The findings and recommendations were discussed                            with the patient.                           - The findings and recommendations were discussed                            with the patient's family. Justice Britain, MD 11/05/2020 10:47:26 AM

## 2020-11-05 NOTE — Progress Notes (Signed)
pt tolerated well. VSS. awake and to recovery. Report given to RN.  

## 2020-11-05 NOTE — Progress Notes (Signed)
Pt's states no medical or surgical changes since previsit or office visit. 

## 2020-11-05 NOTE — Progress Notes (Signed)
Called to room to assist during endoscopic procedure.  Patient ID and intended procedure confirmed with present staff. Received instructions for my participation in the procedure from the performing physician.  

## 2020-11-07 ENCOUNTER — Telehealth: Payer: Self-pay | Admitting: *Deleted

## 2020-11-07 NOTE — Telephone Encounter (Signed)
  Follow up Call-  Call back number 11/05/2020  Post procedure Call Back phone  # [phone]  Permission to leave phone message Yes  Some recent data might be hidden     Patient questions:  Do you have a fever, pain , or abdominal swelling? No. Pain Score  0 *  Have you tolerated food without any problems? Yes.    Have you been able to return to your normal activities? Yes.    Do you have any questions about your discharge instructions: Diet   No. Medications  No. Follow up visit  No.  Do you have questions or concerns about your Care? No.  Actions: * If pain score is 4 or above: 1. No action needed, pain <4.Have you developed a fever since your procedure? no  2.   Have you had an respiratory symptoms (SOB or cough) since your procedure? no  3.   Have you tested positive for COVID 19 since your procedure no  4.   Have you had any family members/close contacts diagnosed with the COVID 19 since your procedure?  no   If yes to any of these questions please route to Joylene John, RN and Joella Prince, RN

## 2020-11-15 ENCOUNTER — Other Ambulatory Visit: Payer: Self-pay

## 2020-11-15 ENCOUNTER — Encounter: Payer: Self-pay | Admitting: Gastroenterology

## 2020-11-15 DIAGNOSIS — D649 Anemia, unspecified: Secondary | ICD-10-CM

## 2020-12-24 ENCOUNTER — Ambulatory Visit (INDEPENDENT_AMBULATORY_CARE_PROVIDER_SITE_OTHER): Payer: Medicare HMO | Admitting: Gastroenterology

## 2020-12-24 ENCOUNTER — Encounter: Payer: Self-pay | Admitting: Gastroenterology

## 2020-12-24 VITALS — BP 120/70 | HR 78 | Ht 74.0 in | Wt 261.0 lb

## 2020-12-24 DIAGNOSIS — K219 Gastro-esophageal reflux disease without esophagitis: Secondary | ICD-10-CM | POA: Diagnosis not present

## 2020-12-24 DIAGNOSIS — Z862 Personal history of diseases of the blood and blood-forming organs and certain disorders involving the immune mechanism: Secondary | ICD-10-CM

## 2020-12-24 DIAGNOSIS — Z944 Liver transplant status: Secondary | ICD-10-CM

## 2020-12-24 DIAGNOSIS — Z86010 Personal history of colonic polyps: Secondary | ICD-10-CM

## 2020-12-24 DIAGNOSIS — K5909 Other constipation: Secondary | ICD-10-CM

## 2020-12-24 NOTE — Patient Instructions (Addendum)
Trial of Metamucil or Benefiber or Fiber Con- Take 1-2 times daily as tolerated.    You have requested to have labs done at [facility]. Lab order have been given to you. Please have results faxed to [phone] attn: [place]  Follow up in 1 year.  If you are age 68 or older, your body mass index should be between 23-30. Your Body mass index is 33.51 kg/m. If this is out of the aforementioned range listed, please consider follow up with your Primary Care Provider.  If you are age 12 or younger, your body mass index should be between 19-25. Your Body mass index is 33.51 kg/m. If this is out of the aformentioned range listed, please consider follow up with your Primary Care Provider.   __________________________________________________________  The [facility] providers would like to encourage you to use [facility] to communicate with providers for non-urgent requests or questions.  Due to long hold times on the telephone, sending your provider a message by [facility] may be a faster and more efficient way to get a response.  Please allow 48 business hours for a response.  Please remember that this is for non-urgent requests.   Thank you for choosing me and [facility].  Dr. Rush Landmark

## 2020-12-24 NOTE — Progress Notes (Signed)
[place] VISIT   Primary Care Provider Shanon Ace, MD [address]  [address] [place] [address] [phone]  Patient Profile: Jonathan Perkins is a 70 y.o. 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  The patient presents to the [facility] for an evaluation and management of problem(s) noted below:  Problem List 1. History of anemia   2. History of liver transplant ([place])   3. GERD without esophagitis   4. Other constipation   5. Hx of adenomatous colonic polyps     History of Present Illness This is a patient that I met in May after being referred for local endoscopic evaluation of alteration in bowel habits and chronic GERD symptoms.  His history is significant for liver transplantation in 2006 and retransplantation 2009 due to biliary complications of his graft.  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  Patient was seen and is followed by Dr. Enis Gash of [facility] in February and thus referred here to [facility] where he lives.  Patient underwent his procedures and was found to have tubular adenomatous and no evidence of Barrett's esophagus.  Recommendation for initiation of MiraLAX on a more frequent basis to help with his alteration of bowel habits was had/discussed.  Today, the patient returns for scheduled follow-up.  He is doing well.  He is using MiraLAX with good effect.  His bowel movements are looser but not overt diarrhea.  No blood in his stools has been noted.  Abdominal discomfort not as significant.  He has been dealing with life stressors with his mother who is in her 50s having some mental health decline for which he and his family are  dealing with.  Patient has a longstanding anemia of unclear etiology, though I suspect it likely as a result of his chronic renal insufficiency for which she follows with nephrology here in [place].  Patient does not take significant nonsteroidals or BC/Goody powders.  GI Review of Systems Positive as above Negative for dysphagia, odynophagia, nausea, vomiting, pain, melena, hematochezia  Review of Systems General: Denies fevers/chills/weight loss unintentionally Cardiovascular: Denies chest pain/palpitations Pulmonary: Denies shortness of breath Gastroenterological: See HPI Genitourinary: Denies darkened urine or hematuria Hematological: Denies easy bruising/bleeding Endocrine: Denies temperature intolerance Dermatological: Denies jaundice Psychological: Mood is stable   Medications Current Outpatient Medications  Medication Sig Dispense Refill   aspirin EC 81 MG tablet Take 81 mg by mouth at bedtime.     Blood Glucose Monitoring Suppl (FIFTY50 GLUCOSE METER 2.0) w/Device KIT Use as directed Product selection permitted according to insurance preference. E11.9 Type 2 diabetes mellitus     cloNIDine (CATAPRES) 0.2 MG tablet Take 0.2 mg by mouth 2 (two) times daily.     cycloSPORINE modified (GENGRAF) 100 MG capsule Take 100 mg by mouth 2 (two) times daily.     desonide (DESOWEN) 0.05 % cream Apply 1 application topically 2 (two) times daily.     glucose blood (PRECISION QID TEST) test strip 1 strip TID. Product selection permitted according to insurance preference. E11.9 Type 2 diabetes mellitus     insulin glargine (LANTUS) 100 UNIT/ML injection Inject 0.4 mLs (40 Units total) into the skin at bedtime. (Patient taking differently: Inject 40 Units into the skin daily with supper.) 10 mL 11  ketoconazole (NIZORAL) 2 % cream Apply 1 application topically daily. Rash on bottom     lisinopril (PRINIVIL,ZESTRIL) 10 MG tablet Take 10 mg by mouth daily.     metoprolol tartrate (LOPRESSOR)  50 MG tablet Take 50 mg by mouth daily.     metroNIDAZOLE (METROGEL) 1 % gel Apply 1 application topically daily as needed (rosacea).      mirabegron ER (MYRBETRIQ) 50 MG TB24 tablet Take by mouth.     ondansetron (ZOFRAN ODT) 4 MG disintegrating tablet Take 1 tablet (4 mg total) by mouth every 8 (eight) hours as needed for nausea or vomiting. 15 tablet 0   oseltamivir (TAMIFLU) 75 MG capsule Take 1 capsule (75 mg total) by mouth every 12 (twelve) hours. 10 capsule 0   pantoprazole (PROTONIX) 40 MG tablet Take 1 tablet (40 mg total) by mouth daily after breakfast. 90 tablet 3   pravastatin (PRAVACHOL) 40 MG tablet TAKE ONE TABLET BY MOUTH AT BEDTIME FOR CHOLESTEROL     pregabalin (LYRICA) 50 MG capsule Take 50 mg by mouth 2 (two) times daily.      tamsulosin (FLOMAX) 0.4 MG CAPS capsule Take 0.4 mg by mouth daily after supper.     traMADol (ULTRAM) 50 MG tablet Take 50-100 mg by mouth every 12 (twelve) hours as needed for moderate pain.      triamcinolone cream (KENALOG) 0.1 % Apply 1 application topically 2 (two) times daily. Rash on bottom     No current facility-administered medications for this visit.    Allergies Allergies  Allergen Reactions   Nsaids Other (See Comments)    Pt on transplant medications     Histories Past Medical History:  Diagnosis Date   Acid reflux    Anemia    Anxiety    Arthritis    Depression    Diabetes mellitus without complication ([place]) 3299   after the first liver transplant   History of hepatitis C 1982   Hyperlipidemia    Hypertension    Liver transplant recipient [facility])    Nerve pain    Overactive bladder    Past Surgical History:  Procedure Laterality Date   CHOLECYSTECTOMY     LIVER TRANSPLANT     2006, 2009   TONSILLECTOMY     TOTAL HIP ARTHROPLASTY  1997   Social History   Socioeconomic History   Marital status: Single    Spouse name: Not on file   Number of children: 0   Years of education: Not on file   Highest education  level: Not on file  Occupational History   Occupation: retired  Tobacco Use   Smoking status: Never   Smokeless tobacco: Never  Vaping Use   Vaping Use: Never used  Substance and Sexual Activity   Alcohol use: Not Currently    Comment: 1982 quit with dx of hepatitis   Drug use: Never   Sexual activity: Not on file  Other Topics Concern   Not on file  Social History Narrative   Not on file   Social Determinants of Health   Financial Resource Strain: Not on file  Food Insecurity: Not on file  Transportation Needs: Not on file  Physical Activity: Not on file  Stress: Not on file  Social Connections: Not on file  Intimate Partner Violence: Not on file   Family History  Problem Relation Age of Onset   Alzheimer's disease Mother    Pulmonary fibrosis Father    Heart attack Father  Colon cancer Neg Hx    Colon polyps Neg Hx    Esophageal cancer Neg Hx    Rectal cancer Neg Hx    Stomach cancer Neg Hx    Inflammatory bowel disease Neg Hx    Liver disease Neg Hx    Pancreatic cancer Neg Hx    I have reviewed his medical, social, and family history in detail and updated the electronic medical record as necessary.    PHYSICAL EXAMINATION  BP 120/70   Pulse 78   Ht '6\' 2"'  (1.88 m)   Wt 261 lb (118.4 kg)   BMI 33.51 kg/m  Wt Readings from Last 3 Encounters:  12/24/20 261 lb (118.4 kg)  11/05/20 261 lb (118.4 kg)  10/22/20 261 lb (118.4 kg)  GEN: NAD, appears stated age, doesn't appear chronically ill PSYCH: Cooperative, without pressured speech EYE: Conjunctivae pink, sclerae anicteric ENT: Masked CV: Nontachycardic RESP: No audible wheezing GI: NABS, soft, protuberant abdomen, rounded, surgical scars present that are well-healed, no rebound or guarding MSK/EXT: Trace bilateral lower extremity edema SKIN: No jaundice NEURO:  Alert & Oriented x 3, no focal deficits   REVIEW OF DATA  I reviewed the following data at the time of this encounter:  GI Procedures  and Studies  May 2022 EGD - No gross lesions in esophagus. Z-line regular, 44 cm from the incisors. - Non-obstructing Schatzki ring. - 3 cm hiatal hernia. 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 A few were removed for biopsy. - A single gastric polyp in antrum. Resected and retrieved. Clip (MR conditional) was placed. - Gastritis in body and antrum. No other gross lesions in the stomach. Biopsied. - No gross lesions in the duodenal bulb, in the first portion of the duodenum and in the second portion of the duodenum.  May 2022 colonoscopy - Hemorrhoids found on digital rectal exam. - There was significant looping of the colon. - Six 3 to 10 mm polyps at the recto-sigmoid colon, at the splenic flexure, in the transverse colon, in the ascending colon and in the cecum, removed with a cold snare. Resected and retrieved. - Diverticulosis in the recto-sigmoid colon and in the sigmoid colon. - Normal mucosa in the entire examined colon otherwise. - Non-bleeding non-thrombosed internal hemorrhoids.  Pathology Diagnosis 1. Surgical [P], random gastric sites - MILD CHRONIC GASTRITIS. - WARTHIN-STARRY STAIN IS NEGATIVE FOR HELICOBACTER PYLORI. 2. Surgical [P], gastric body polyps - FUNDIC GLAND POLYPS. 3. Surgical [P], gastric antrum polyp - HYPERPLASTIC POLYP. 4. Surgical [P], colon, recto-sigmoid, splenic flexure, transverse, ascending and cecum, polyp (6) - TUBULAR ADENOMA, NEGATIVE FOR HIGH GRADE DYSPLASIA (X MULTIPLE). - HYPERPLASTIC POLYP (X MULTIPLE).  Laboratory Studies  Reviewed those in epic and care everywhere  Imaging Studies  No imaging studies to review   ASSESSMENT  Mr. Koren is a 70 y.o. 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  The patient is seen today for evaluation and management of:  1. History of anemia   2. History of liver transplant ([place])   3. GERD without esophagitis   4. Other constipation   5. Hx of adenomatous colonic polyps    The patient is hemodynamically and clinically stable.  The patient's alteration of bowel habits with more significant constipation recently has improved with MiraLAX therapy.  I will asked the  patient to initiate fiber supplementation as well to see if that we will optimize things even further using Benefiber or Metamucil.  This may help him come off MiraLAX therapy.  Patient will be due for colon cancer screening and colon polyp surveillance in 3 years due to the number of adenomatous polyps found.  Patient's current GERD symptoms are well controlled on his once daily PPI.  If the patient develops issues of dysphagia in the future we will consider Schatzki ring disruption/dilatation.  Patient has had a longstanding anemia, suspect it is likely a result of his chronic renal insufficiency but if we find that he has evidence of iron deficiency or micronutrient deficiency.  If he has evidence of iron deficiency we will need to consider the role of video capsule endoscopy.  I will have him get his labs done at [facility] where he has his labs done 4 to Otology every couple of months.  Those will be faxed back to me for review thereafter.  I will see the patient back on an as-needed basis.  All patient questions were answered to the best of my ability, and the patient agrees to the aforementioned plan of action with follow-up as indicated.   PLAN  Will obtain CBC/iron/TIBC/ferritin/B12/folate levels to further evaluate anemia (placed under [facility]) If evidence of iron deficiency anemia is present will consider video capsule endoscopy after discussion with patient Continue 40 mg PPI once daily for GERD symptoms and if patient is doing really well in follow-up consider decreasing dose to 20  mg in future Continue MiraLAX Initiate fiber supplementation once daily in effort of trying to decrease overall MiraLAX use as able EGD with dilatation if evidence of dysphagia symptoms in future Colonoscopy for surveillance in 2025 Liver disease treatment and immunosuppression as per Dr. Loletha Grayer and [facility] hepatology   No orders of the defined types were placed in this encounter.   New Prescriptions   No medications on file   Modified Medications   No medications on file    Planned Follow Up Return if symptoms worsen or fail to improve.   Total Time in Face-to-Face and in Coordination of Care for patient including independent/personal interpretation/review of prior testing, medical history, examination, medication adjustment, communicating results with the patient directly, and documentation with the EHR is 25 minutes.   Justice Britain, MD [facility] Advanced Endoscopy Office # [phone]

## 2021-04-18 IMAGING — US US RENAL
1 series · 14 of 25 positions shown · non-contrast
Comparison: None available.

CLINICAL DATA: Initial evaluation for stage III A chronic kidney
disease.

EXAM:
RENAL / URINARY TRACT ULTRASOUND COMPLETE

[Series 1: us renal · 0.28mm/px · 14 of 46 slices shown]
[im 1/46]
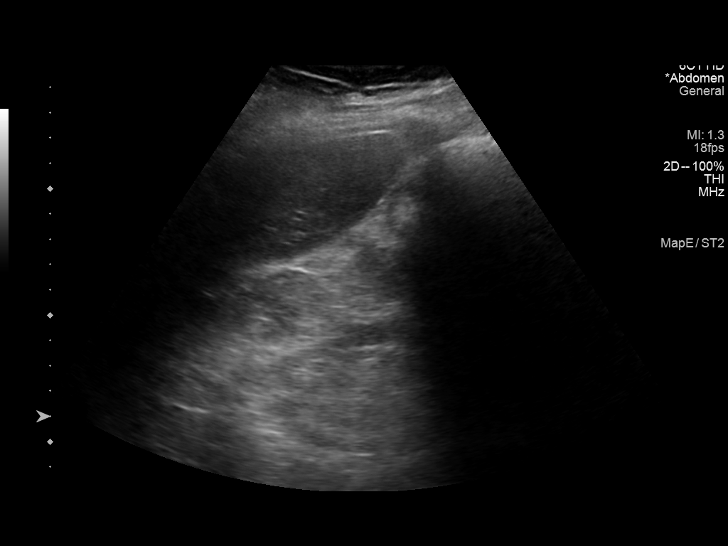
[im 4/46]
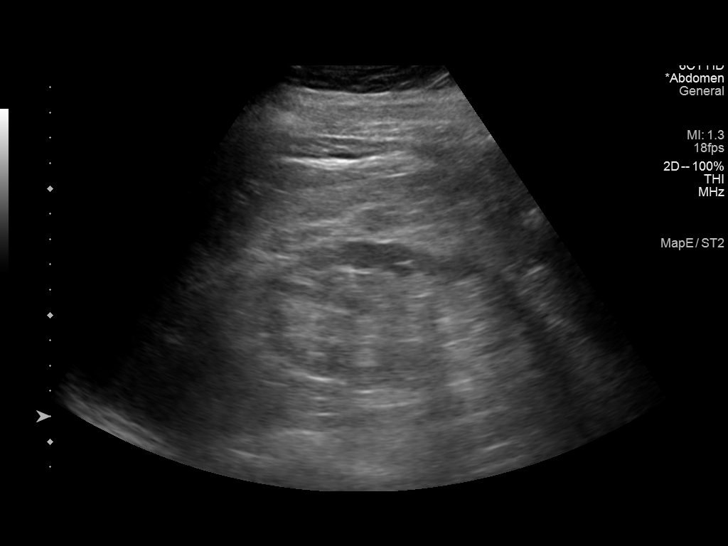
[im 8/46]
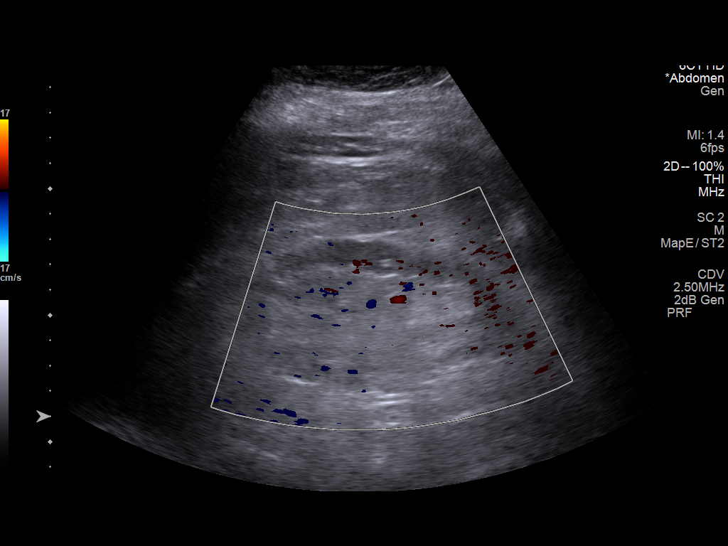
[im 12/46]
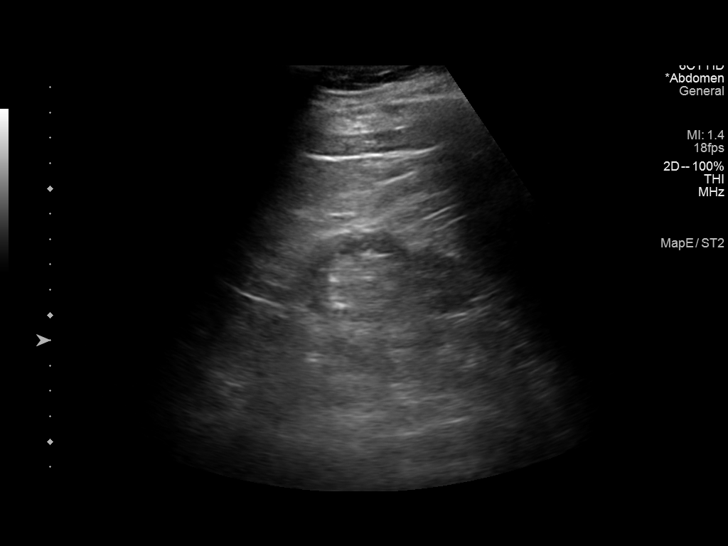
[im 16/46]
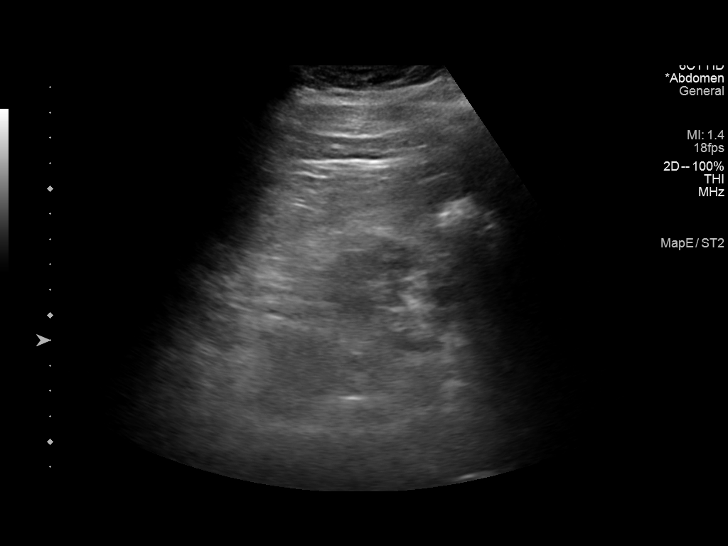
[im 17/46]
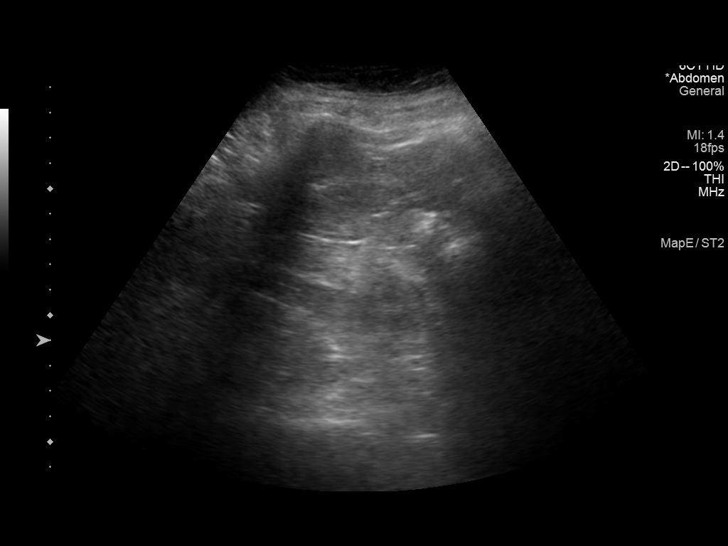
[im 21/46]
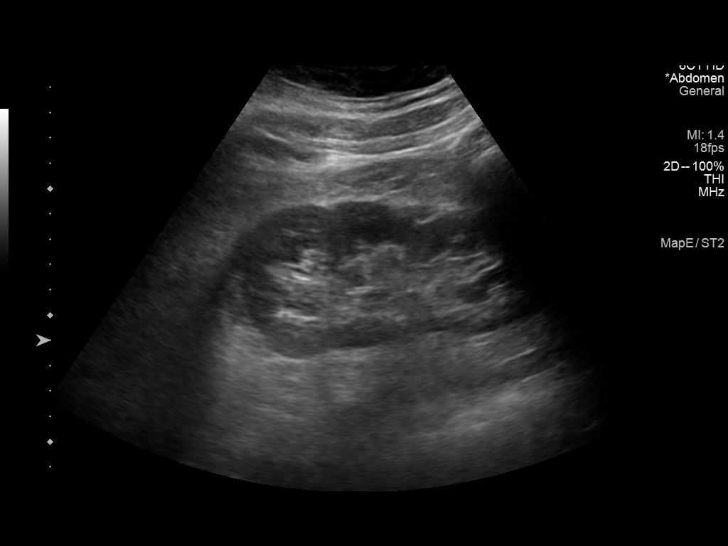
[im 25/46]
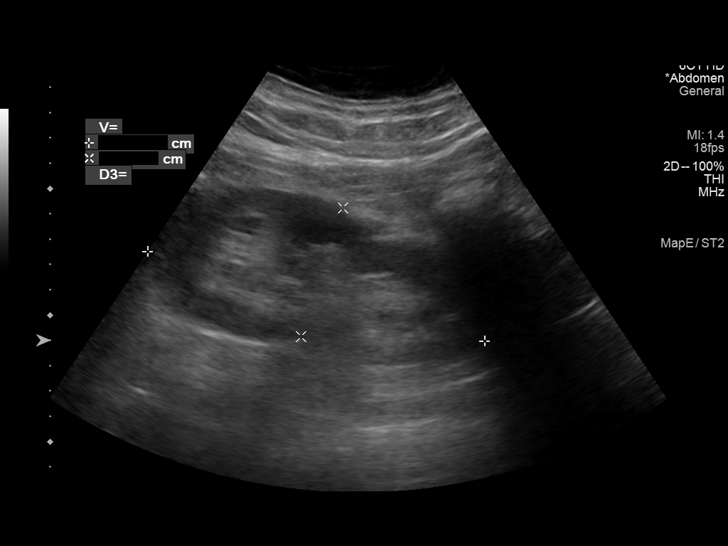
[im 29/46]
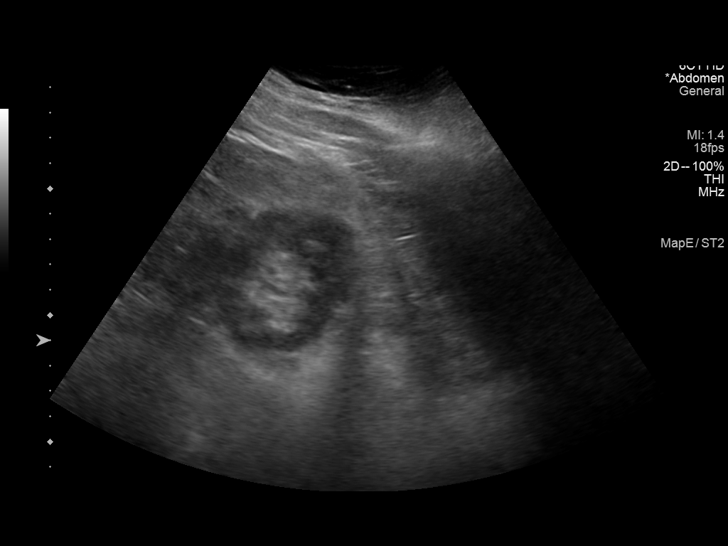
[im 31/46]
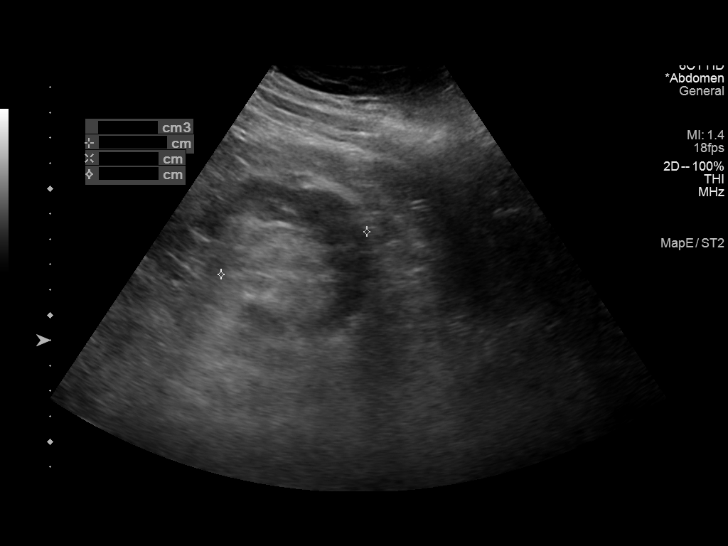
[im 34/46]
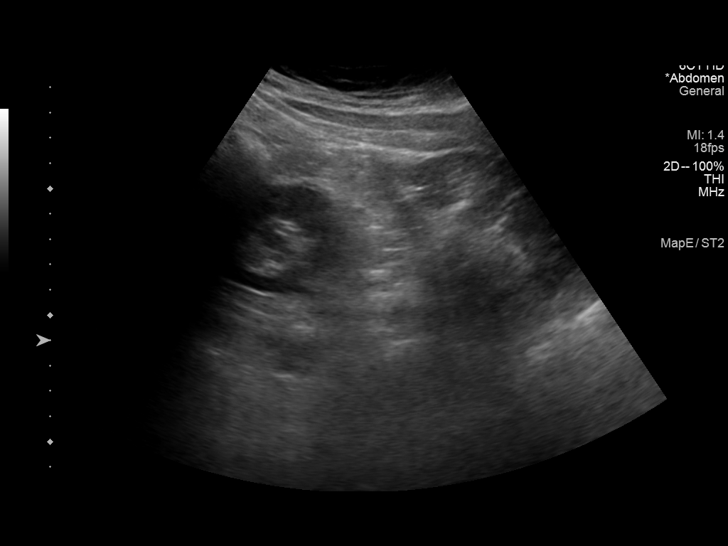
[im 38/46]
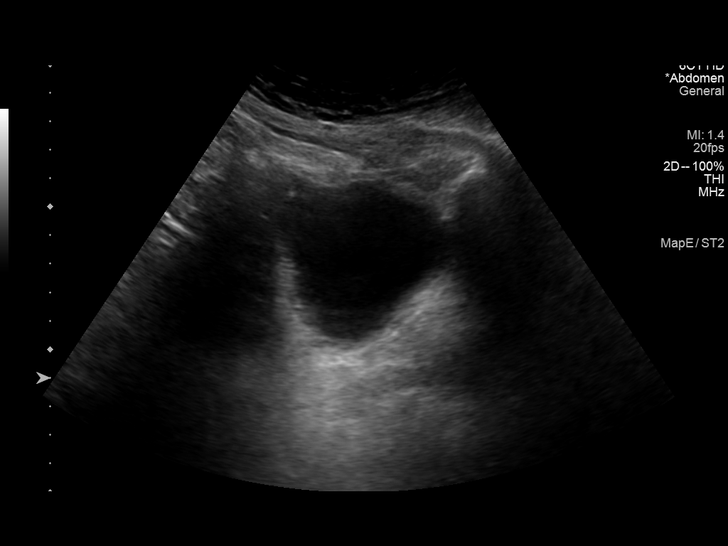
[im 42/46]
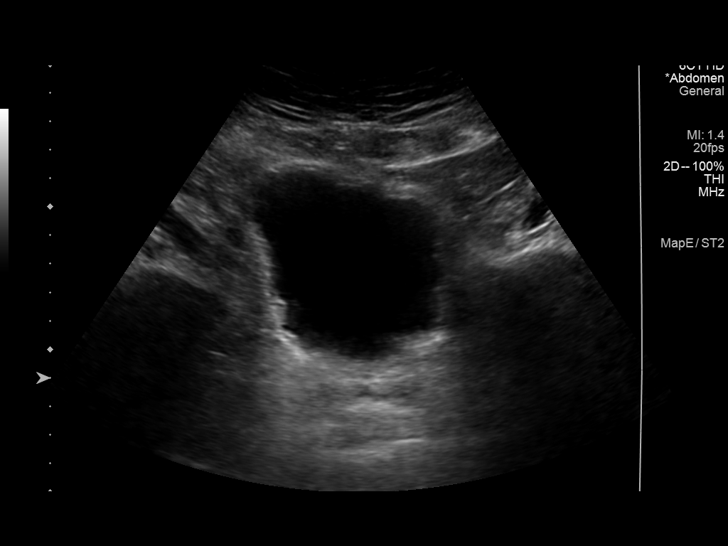
[im 46/46]
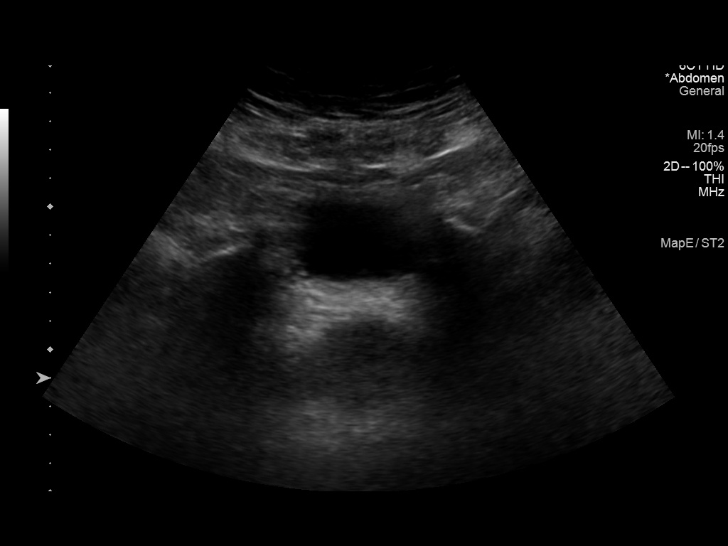

[14 of 25 positions shown; findings below may reference images not displayed]

FINDINGS: Right Kidney:

Renal measurements: 10.7 x 5.7 x 4.8 cm = volume: 153.3 mL. Chronic
diffuse cortical thinning with increased echogenicity within the
renal parenchyma. No nephrolithiasis or hydronephrosis. No focal
renal mass.

Left Kidney:

Renal measurements: 13.8 x 5.4 x 6.0 cm = volume: 231.4 mL. Renal
echogenicity within normal limits. Renal cortex with relatively
preserved. No nephrolithiasis or hydronephrosis. No focal renal
mass.

Bladder:

Appears normal for degree of bladder distention.

Other:

None.
IMPRESSION: 1. Asymmetric right renal atrophy with associated chronic diffuse
cortical thinning and increased echogenicity within the renal
parenchyma, compatible with chronic medical renal disease.
2. Relatively normal appearance of the left kidney.
3. No hydronephrosis or other significant finding.

## 2022-01-27 ENCOUNTER — Other Ambulatory Visit: Payer: Self-pay | Admitting: Gastroenterology

## 2022-04-24 ENCOUNTER — Other Ambulatory Visit: Payer: Self-pay | Admitting: Gastroenterology

## 2022-10-18 ENCOUNTER — Other Ambulatory Visit: Payer: Self-pay | Admitting: Gastroenterology

## 2022-10-19 NOTE — Telephone Encounter (Signed)
Patient needs to schedule follow up appointment.

## 2022-11-06 ENCOUNTER — Ambulatory Visit: Payer: Medicare HMO

## 2022-11-06 ENCOUNTER — Ambulatory Visit: Payer: Medicare HMO | Admitting: Podiatry

## 2022-11-06 ENCOUNTER — Ambulatory Visit (INDEPENDENT_AMBULATORY_CARE_PROVIDER_SITE_OTHER): Payer: Medicare HMO

## 2022-11-06 DIAGNOSIS — M2170 Unequal limb length (acquired), unspecified site: Secondary | ICD-10-CM

## 2022-11-06 DIAGNOSIS — M79672 Pain in left foot: Secondary | ICD-10-CM

## 2022-11-06 DIAGNOSIS — E119 Type 2 diabetes mellitus without complications: Secondary | ICD-10-CM | POA: Diagnosis not present

## 2022-11-06 MED ORDER — TRIAMCINOLONE ACETONIDE 0.5 % EX OINT: 1.0000 | TOPICAL_OINTMENT | Freq: Two times a day (BID) | CUTANEOUS | 1 refills | Status: AC

## 2022-11-06 NOTE — Progress Notes (Unsigned)
   Chief Complaint  Patient presents with   Diabetes    Diabetic foot care, nail trim, A1c- 7.0 BG- 147,     HPI: 72 y.o. male T2DM presenting today for routine diabetic foot exam.  Patient was very frustrated due to the wait time to be seen.  Patient also states that he has been seeing his PCP for management of inflammatory skin lesions to the left leg.  He has been applying mupirocin ointment with no improvement.  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  He wears 1/4 inch heel lift in his right shoe.  Past Medical History:  Diagnosis Date   Acid reflux    Anemia    Anxiety    Arthritis    Depression    Diabetes mellitus without complication (HCC) 2006   after the first liver transplant   History of hepatitis C 1982   Hyperlipidemia    Hypertension    Liver transplant recipient [facility])    Nerve pain    Overactive bladder     Past Surgical History:  Procedure Laterality Date   CHOLECYSTECTOMY     LIVER TRANSPLANT     2006, 2009   TONSILLECTOMY     TOTAL HIP ARTHROPLASTY  1997    Allergies  Allergen Reactions   Nsaids Other (See Comments)    Pt on transplant medications      RT foot 11/06/2022  LT foot 11/06/2022  Physical Exam: General: The patient is alert and oriented x3 in no acute distress.  Dermatology: Skin is warm, dry and supple bilateral lower extremities.  Intermittent focal skin lesions noted to the right foot and left leg.  Asymptomatic.  Vascular: Palpable pedal pulses bilaterally. Capillary refill within normal limits.  No appreciable edema.  No erythema.  Neurological: Grossly intact via light touch  Musculoskeletal Exam: No pedal deformities noted   Assessment/Plan of Care: 1.  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  Focal dermatitis lesions -Prescription for 0.5% Kenalog ointment apply 2  times daily  3.  Encounter for diabetic foot exam -Comprehensive diabetic foot exam performed today. -Return to clinic annually   Felecia Shelling, DPM [facility]  Dr. Felecia Shelling, DPM    [address] [place], [place] [address]                Office [phone]  Fax [phone]

## 2022-11-06 NOTE — Patient Instructions (Signed)
[place] or [place] stage manager for Primary Care

## 2022-11-17 ENCOUNTER — Other Ambulatory Visit: Payer: Self-pay | Admitting: Podiatry

## 2022-11-17 DIAGNOSIS — M79672 Pain in left foot: Secondary | ICD-10-CM

## 2022-11-17 DIAGNOSIS — M2170 Unequal limb length (acquired), unspecified site: Secondary | ICD-10-CM

## 2022-11-17 DIAGNOSIS — E119 Type 2 diabetes mellitus without complications: Secondary | ICD-10-CM

## 2022-12-13 ENCOUNTER — Other Ambulatory Visit: Payer: Self-pay | Admitting: Gastroenterology

## 2022-12-21 ENCOUNTER — Other Ambulatory Visit: Payer: Self-pay | Admitting: Gastroenterology

## 2022-12-21 NOTE — Telephone Encounter (Signed)
Needs an office appointment

## 2023-02-07 ENCOUNTER — Other Ambulatory Visit: Payer: Self-pay | Admitting: Gastroenterology

## 2023-02-08 NOTE — Telephone Encounter (Signed)
Needs to contact office to schedule follow-up

## 2023-02-09 ENCOUNTER — Other Ambulatory Visit: Payer: Self-pay | Admitting: Gastroenterology

## 2023-02-09 NOTE — Telephone Encounter (Signed)
PATIENT NEEDS APPOINTMENT 

## 2023-02-21 ENCOUNTER — Other Ambulatory Visit: Payer: Self-pay | Admitting: Gastroenterology

## 2023-02-24 ENCOUNTER — Other Ambulatory Visit: Payer: Self-pay | Admitting: Gastroenterology

## 2023-06-14 ENCOUNTER — Other Ambulatory Visit: Payer: Self-pay | Admitting: Podiatry

## 2023-07-15 ENCOUNTER — Other Ambulatory Visit: Payer: Self-pay | Admitting: Nephrology

## 2023-07-15 DIAGNOSIS — R600 Localized edema: Secondary | ICD-10-CM

## 2023-07-16 ENCOUNTER — Ambulatory Visit
Admission: RE | Admit: 2023-07-16 | Discharge: 2023-07-16 | Disposition: A | Discharge status: Home / Self Care | Payer: Medicare Other | Source: Ambulatory Visit | Attending: Nephrology | Admitting: Nephrology

## 2023-07-16 DIAGNOSIS — R600 Localized edema: Secondary | ICD-10-CM

## 2023-10-26 ENCOUNTER — Other Ambulatory Visit: Payer: Self-pay | Admitting: Geriatric Medicine

## 2023-10-26 DIAGNOSIS — I82432 Acute embolism and thrombosis of left popliteal vein: Secondary | ICD-10-CM

## 2023-11-02 ENCOUNTER — Ambulatory Visit
Admission: RE | Admit: 2023-11-02 | Discharge: 2023-11-02 | Disposition: A | Discharge status: Home / Self Care | Source: Ambulatory Visit | Attending: Geriatric Medicine | Admitting: Geriatric Medicine

## 2023-11-02 DIAGNOSIS — I82432 Acute embolism and thrombosis of left popliteal vein: Secondary | ICD-10-CM

## 2024-01-21 ENCOUNTER — Encounter: Payer: Self-pay | Admitting: Neurology

## 2024-02-04 ENCOUNTER — Other Ambulatory Visit (HOSPITAL_COMMUNITY): Payer: Self-pay | Admitting: Cardiology

## 2024-02-04 DIAGNOSIS — R079 Chest pain, unspecified: Secondary | ICD-10-CM

## 2024-02-18 ENCOUNTER — Encounter (HOSPITAL_COMMUNITY): Payer: Self-pay

## 2024-02-18 ENCOUNTER — Ambulatory Visit (HOSPITAL_COMMUNITY)
Admission: RE | Admit: 2024-02-18 | Discharge: 2024-02-18 | Disposition: A | Discharge status: Home / Self Care | Source: Ambulatory Visit | Attending: Cardiology | Admitting: Cardiology

## 2024-02-18 ENCOUNTER — Inpatient Hospital Stay (HOSPITAL_COMMUNITY)
Admission: RE | Admit: 2024-02-18 | Discharge: 2024-02-18 | Disposition: A | Discharge status: Home / Self Care | Source: Ambulatory Visit

## 2024-02-18 DIAGNOSIS — R079 Chest pain, unspecified: Secondary | ICD-10-CM | POA: Diagnosis present

## 2024-02-18 MED ORDER — REGADENOSON 0.4 MG/5ML IV SOLN
0.4000 mg | Freq: Once | INTRAVENOUS | Status: AC
  Administered 2024-02-18: 0.4 mg via INTRAVENOUS
  Filled 2024-02-18: qty 5

## 2024-02-18 MED ORDER — TECHNETIUM TC 99M TETROFOSMIN IV KIT
30.0000 | PACK | Freq: Once | INTRAVENOUS | Status: AC | PRN
  Administered 2024-02-18: 30 via INTRAVENOUS

## 2024-02-18 MED ORDER — REGADENOSON 0.4 MG/5ML IV SOLN
INTRAVENOUS | Status: AC
  Filled 2024-02-18: qty 5

## 2024-02-18 MED ORDER — TECHNETIUM TC 99M TETROFOSMIN IV KIT
9.6900 | PACK | Freq: Once | INTRAVENOUS | Status: AC
  Administered 2024-02-18: 9.69 via INTRAVENOUS

## 2024-03-09 ENCOUNTER — Inpatient Hospital Stay

## 2024-03-09 ENCOUNTER — Inpatient Hospital Stay: Attending: Hematology and Oncology | Admitting: Hematology and Oncology

## 2024-03-09 VITALS — BP 130/56 | HR 81 | Temp 98.7°F | Resp 18 | Ht 74.0 in | Wt 265.1 lb

## 2024-03-09 DIAGNOSIS — I890 Lymphedema, not elsewhere classified: Secondary | ICD-10-CM | POA: Insufficient documentation

## 2024-03-09 DIAGNOSIS — I825Z2 Chronic embolism and thrombosis of unspecified deep veins of left distal lower extremity: Secondary | ICD-10-CM

## 2024-03-09 DIAGNOSIS — I82502 Chronic embolism and thrombosis of unspecified deep veins of left lower extremity: Secondary | ICD-10-CM | POA: Insufficient documentation

## 2024-03-09 DIAGNOSIS — I82402 Acute embolism and thrombosis of unspecified deep veins of left lower extremity: Secondary | ICD-10-CM | POA: Insufficient documentation

## 2024-03-09 DIAGNOSIS — Z7901 Long term (current) use of anticoagulants: Secondary | ICD-10-CM | POA: Diagnosis not present

## 2024-03-09 DIAGNOSIS — N183 Chronic kidney disease, stage 3 unspecified: Secondary | ICD-10-CM | POA: Diagnosis not present

## 2024-03-09 NOTE — Assessment & Plan Note (Signed)
 Unprovoked left leg DVT January 2025. Repeat ultrasound August 2025: Chronic DVT  Recommendation: Evaluate for hypercoagulability risk factors Postpone the ultrasound of the left leg till January 2026. Conditions for stopping anticoagulation: Complete resolution of DVT, no evidence of any hypercoagulable risk factors and a negative D-dimer.  I will call the patient in 1 week with the results of hypercoagulability panel drawn today.

## 2024-03-09 NOTE — Progress Notes (Signed)
 [facility] CONSULT NOTE  Patient Care Team: Katina Pfeiffer, PA-C as PCP - General (Family Medicine)  CHIEF COMPLAINTS/PURPOSE OF CONSULTATION: Left leg DVT   HISTORY OF PRESENTING ILLNESS:   History of Present Illness Jonathan Perkins is a 73 year old male with chronic deep vein thrombosis who presents for evaluation of blood clot risk factors and management. He was referred by Dr. Rome, a vascular surgeon, for evaluation of blood clot risk factors and management.  He has experienced left leg swelling for two years, with a diagnosis of unprovoked deep vein thrombosis confirmed this past summer. He is currently on Eliquis for anticoagulation therapy, initiated on July 31, 2023.  His medical history includes normal pressure hydrocephalus, which affected his mobility until a shunt was placed a year ago, improving his ability to walk.  His family history reveals no known blood clots. His father had a heart attack in his fifties and later died of pulmonary fibrosis. His mother lived to 69 years and died of old age.  He is retired and has not traveled since retirement, following a transplant in 2006. He had previously undergone liver transplantation's in 2006 and 2009.  He is currently on cyclosporine  for immunosuppression.    I reviewed her records extensively and collaborated the history with the patient.  SUMMARY OF ONCOLOGIC HISTORY: Oncology History   No history exists.     MEDICAL HISTORY:  Past Medical History:  Diagnosis Date   Acid reflux    Anemia    Anxiety    Arthritis    Depression    Diabetes mellitus without complication (HCC) 2006   after the first liver transplant   History of hepatitis C 1982   Hyperlipidemia    Hypertension    Liver transplant recipient [facility])    Nerve pain    Overactive bladder     SURGICAL HISTORY: Past Surgical History:  Procedure Laterality Date   CHOLECYSTECTOMY     LIVER TRANSPLANT     2006, 2009    TONSILLECTOMY     TOTAL HIP ARTHROPLASTY  1997    SOCIAL HISTORY: Social History   Socioeconomic History   Marital status: Married    Spouse name: Not on file   Number of children: 0   Years of education: Not on file   Highest education level: Not on file  Occupational History   Occupation: retired  Tobacco Use   Smoking status: Never   Smokeless tobacco: Never  Vaping Use   Vaping status: Never Used  Substance and Sexual Activity   Alcohol use: Not Currently    Comment: 1982 quit with dx of hepatitis   Drug use: Never   Sexual activity: Not on file  Other Topics Concern   Not on file  Social History Narrative   Not on file   Social Drivers of Health   Financial Resource Strain: Patient Declined (09/21/2023)   Received from [facility]   Overall Financial Resource Strain (CARDIA)    Difficulty of Paying Living Expenses: Patient declined  Food Insecurity: Patient Declined (09/21/2023)   Received from [facility]   Hunger Vital Sign    Within the past 12 months, you worried that your food would run out before you got the money to buy more.: Patient declined    Within the past 12 months, the food you bought just didn't last and you didn't have money to get more.: Patient declined  Transportation Needs: Patient Declined (09/21/2023)   Received from [facility]   PRAPARE - Transportation    Lack of Transportation (Medical): Patient declined    Lack of Transportation (Non-Medical): Patient declined  Physical Activity: Unknown (07/28/2022)   Received from [facility]   Exercise Vital Sign    On average, how many days per week do you engage in moderate to strenuous exercise (like a brisk walk)?: 0 days    Minutes of Exercise per Session: Not on file  Recent Concern: Physical Activity - Inactive (07/28/2022)   Received from [facility]   Exercise Vital Sign    Days of Exercise per Week: 0 days    Minutes of Exercise per Session: 30 min  Stress: No Stress Concern  Present (07/28/2022)   Received from [facility] - Occupational Stress Questionnaire    Feeling of Stress : Only a little  Social Connections: Moderately Integrated (07/28/2022)   Received from [facility]   Social Network    How would you rate your social network (family, work, friends)?: Adequate participation with social networks  Intimate Partner Violence: Not At Risk (07/28/2022)   Received from [facility]   HITS    Over the last 12 months how often did your partner physically hurt you?: Never    Over the last 12 months how often did your partner insult you or talk down to you?: Never    Over the last 12 months how often did your partner threaten you with physical harm?: Never    Over the last 12 months how often did your partner scream or curse at you?: Never    FAMILY HISTORY: Family History  Problem Relation Age of Onset   Alzheimer's disease Mother    Pulmonary fibrosis Father    Heart attack Father    Colon cancer Neg Hx    Colon polyps Neg Hx    Esophageal cancer Neg Hx    Rectal cancer Neg Hx    Stomach cancer Neg Hx    Inflammatory bowel disease Neg Hx    Liver disease Neg Hx    Pancreatic cancer Neg Hx     ALLERGIES:  is allergic to nsaids.  MEDICATIONS:  Current Outpatient Medications  Medication Sig Dispense Refill   aspirin  EC 81 MG tablet Take 81 mg by mouth at bedtime.     Blood Glucose Monitoring Suppl (FIFTY50 GLUCOSE METER 2.0) w/Device KIT Use as directed Product selection permitted according to insurance preference. E11.9 Type 2 diabetes mellitus     cloNIDine  (CATAPRES ) 0.2 MG tablet Take 0.2 mg by mouth 2 (two) times daily.     cycloSPORINE  modified (GENGRAF ) 100 MG capsule Take 100 mg by mouth 2 (two) times daily.     desonide  (DESOWEN ) 0.05 % cream Apply 1 application topically 2 (two) times daily.     glucose blood (PRECISION QID TEST) test strip 1 strip TID. Product selection permitted according to  insurance preference. E11.9 Type 2 diabetes mellitus     insulin  glargine (LANTUS ) 100 UNIT/ML injection Inject 0.4 mLs (40 Units total) into the skin at bedtime. (Patient taking differently: Inject 40 Units into the skin daily with supper.) 10 mL 11   ketoconazole  (NIZORAL ) 2 % cream Apply 1 application topically daily. Rash on bottom     lisinopril (PRINIVIL,ZESTRIL) 10 MG tablet Take 10 mg by mouth daily.     metoprolol  tartrate (LOPRESSOR ) 50 MG tablet Take 50 mg by mouth daily.     metroNIDAZOLE  (METROGEL ) 1 % gel Apply  1 application topically daily as needed (rosacea).      mirabegron ER (MYRBETRIQ) 50 MG TB24 tablet Take by mouth.     ondansetron  (ZOFRAN  ODT) 4 MG disintegrating tablet Take 1 tablet (4 mg total) by mouth every 8 (eight) hours as needed for nausea or vomiting. 15 tablet 0   oseltamivir  (TAMIFLU ) 75 MG capsule Take 1 capsule (75 mg total) by mouth every 12 (twelve) hours. 10 capsule 0   pantoprazole  (PROTONIX ) 40 MG tablet TAKE 1 TABLET BY MOUTH DAILY 60 tablet 0   pravastatin  (PRAVACHOL ) 40 MG tablet TAKE ONE TABLET BY MOUTH AT BEDTIME FOR CHOLESTEROL     pregabalin  (LYRICA ) 50 MG capsule Take 50 mg by mouth 2 (two) times daily.      tamsulosin  (FLOMAX ) 0.4 MG CAPS capsule Take 0.4 mg by mouth daily after supper.     traMADol  (ULTRAM ) 50 MG tablet Take 50-100 mg by mouth every 12 (twelve) hours as needed for moderate pain.      triamcinolone  ointment (KENALOG ) 0.5 % Apply 1 Application topically 2 (two) times daily. 30 g 1   No current facility-administered medications for this visit.    REVIEW OF SYSTEMS:   Constitutional: Denies fevers, chills or abnormal night sweats   All other systems were reviewed with the patient and are negative.  PHYSICAL EXAMINATION: ECOG PERFORMANCE STATUS: 1 - Symptomatic but completely ambulatory  Vitals:   03/09/24 1302  BP: (!) 130/56  Pulse: 81  Resp: 18  Temp: 98.7 F (37.1 C)  SpO2: 100%   Filed Weights   03/09/24 1302   Weight: 265 lb 1.6 oz (120.2 kg)    GENERAL:alert, no distress and comfortable     LABORATORY DATA:  I have reviewed the data as listed Lab Results  Component Value Date   WBC 5.3 08/07/2018   HGB 12.6 (L) 08/07/2018   HCT 40.6 08/07/2018   MCV 86.4 08/07/2018   PLT 92 (L) 08/07/2018   Lab Results  Component Value Date   NA 137 08/07/2018   K 4.3 08/07/2018   CL 106 08/07/2018   CO2 22 08/07/2018    RADIOGRAPHIC STUDIES: I have personally reviewed the radiological reports and agreed with the findings in the report.  ASSESSMENT AND PLAN:  Left leg DVT (HCC) Unprovoked left leg DVT January 2025. Repeat ultrasound August 2025: Chronic DVT  Recommendation: Evaluate for hypercoagulability risk factors Postpone the ultrasound of the left leg till January 2026. Conditions for stopping anticoagulation: Complete resolution of DVT, no evidence of any hypercoagulable risk factors and a negative D-dimer.  I will call the patient in 1 week with the results of hypercoagulability panel drawn today.  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 On Eliquis since July 31, 2023. Ultrasound confirmed chronic DVT. - Order blood tests for inherited or acquired DVT risk factors, including antiphospholipid antibody syndrome. - Postpone ultrasound re-evaluation for six months. - Continue Eliquis for at least one year. - Inform vascular surgeon about the plan to postpone the ultrasound.  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 - Continue complete decongestive therapy and use of compression garments.     All questions were answered. The patient knows to call the clinic with any problems, questions or  concerns.  Viinay K Montrice Montuori, MD 03/09/24

## 2024-03-10 LAB — LUPUS ANTICOAGULANT PANEL
DRVVT: 74.4 s — ABNORMAL HIGH (ref 0.0–47.0)
PTT Lupus Anticoagulant: 33.7 s (ref 0.0–43.5)

## 2024-03-10 LAB — ANTITHROMBIN III ANTIGEN: AT III AG PPP IMM-ACNC: 96 % (ref 72–124)

## 2024-03-10 LAB — DRVVT MIX: dRVVT Mix: 55 s — ABNORMAL HIGH (ref 0.0–40.4)

## 2024-03-10 LAB — PROTEIN S, TOTAL: Protein S Ag, Total: 108 % (ref 60–150)

## 2024-03-10 LAB — DRVVT CONFIRM: dRVVT Confirm: 0.9 ratio (ref 0.8–1.2)

## 2024-03-11 LAB — BETA-2-GLYCOPROTEIN I ABS, IGG/M/A
Beta-2 Glyco I IgG: <9 GPI IgG units (ref 0–20)
Beta-2-Glycoprotein I IgA: <9 GPI IgA units (ref 0–25)
Beta-2-Glycoprotein I IgM: <9 GPI IgM units (ref 0–32)

## 2024-03-11 LAB — CARDIOLIPIN ANTIBODIES, IGG, IGM, IGA
Anticardiolipin IgA: <9 U/mL (ref 0–11)
Anticardiolipin IgG: <9 GPL U/mL (ref 0–14)
Anticardiolipin IgM: <9 [MPL'U]/mL (ref 0–12)

## 2024-03-12 LAB — PROTEIN C, TOTAL: Protein C, Total: 81 % (ref 60–150)

## 2024-03-13 LAB — FACTOR 5 LEIDEN

## 2024-03-14 LAB — PROTHROMBIN GENE MUTATION

## 2024-03-16 ENCOUNTER — Inpatient Hospital Stay (HOSPITAL_BASED_OUTPATIENT_CLINIC_OR_DEPARTMENT_OTHER): Admitting: Hematology and Oncology

## 2024-03-16 DIAGNOSIS — N183 Chronic kidney disease, stage 3 unspecified: Secondary | ICD-10-CM

## 2024-03-16 DIAGNOSIS — Z7901 Long term (current) use of anticoagulants: Secondary | ICD-10-CM | POA: Diagnosis not present

## 2024-03-16 DIAGNOSIS — I82502 Chronic embolism and thrombosis of unspecified deep veins of left lower extremity: Secondary | ICD-10-CM

## 2024-03-16 DIAGNOSIS — I825Y2 Chronic embolism and thrombosis of unspecified deep veins of left proximal lower extremity: Secondary | ICD-10-CM

## 2024-03-16 NOTE — Assessment & Plan Note (Signed)
 Unprovoked left leg DVT January 2025. Repeat ultrasound August 2025: Chronic DVT   Recommendation: Hypercoagulability workup 03/09/2024: No lupus anticoagulant, factor V Leiden negative, prothrombin gene mutation negative, protein C 81%, protein S 108%, Antithrombin III  96%, anticardiolipin antibodies: Negative, beta-2  glycoprotein 1 antibodies: Negative Postpone the ultrasound of the left leg till January 2026. Conditions for stopping anticoagulation: Complete resolution of DVT, and a negative D-dimer.   Follow-up after the ultrasound and labs in January 2026

## 2024-03-16 NOTE — Progress Notes (Signed)
 HEMATOLOGY-ONCOLOGY TELEPHONE VISIT PROGRESS NOTE  I connected with our patient on 03/16/24 at  3:15 PM EDT by telephone and verified that I am speaking with the correct person using two identifiers.  I discussed the limitations, risks, security and privacy concerns of performing an evaluation and management service by telephone and the availability of in person appointments.  I also discussed with the patient that there may be a patient responsible charge related to this service. The patient expressed understanding and agreed to proceed.   History of Present Illness: Follow-up to review the results of hypercoagulability workup  History of Present Illness Jonathan Perkins is a 73 year old male with stage three chronic kidney disease who presents for follow-up regarding blood work and anticoagulation management.  Recent blood work results are negative for antibodies and genetic inherited factors. He is currently on anticoagulation therapy. He has stage three chronic kidney disease and is awaiting further lab results from his nephrologist. He experiences confusion and difficulty managing appointments, particularly with scheduling an ultrasound.    REVIEW OF SYSTEMS:   Constitutional: Denies fevers, chills or abnormal weight loss All other systems were reviewed with the patient and are negative. Observations/Objective:     Assessment Plan:  Left leg DVT (HCC) Unprovoked left leg DVT January 2025. Repeat ultrasound August 2025: Chronic DVT   Recommendation: Hypercoagulability workup 03/09/2024: No lupus anticoagulant, factor V Leiden negative, prothrombin gene mutation negative, protein C 81%, protein S 108%, Antithrombin III  96%, anticardiolipin antibodies: Negative, beta-2  glycoprotein 1 antibodies: Negative Postpone the ultrasound of the left leg till January 2026. Conditions for stopping anticoagulation: Complete resolution of DVT, and a negative D-dimer.   Follow-up after the  ultrasound and labs in January 2026      I discussed the assessment and treatment plan with the patient. The patient was provided an opportunity to ask questions and all were answered. The patient agreed with the plan and demonstrated an understanding of the instructions. The patient was advised to call back or seek an in-person evaluation if the symptoms worsen or if the condition fails to improve as anticipated.   I provided 20 minutes of non-face-to-face time during this encounter.  This includes time for charting and coordination of care   Naomi MARLA Chad, MD

## 2024-03-20 ENCOUNTER — Encounter: Payer: Self-pay | Admitting: Nephrology

## 2024-03-21 ENCOUNTER — Encounter: Payer: Self-pay | Admitting: Gastroenterology

## 2024-07-12 ENCOUNTER — Inpatient Hospital Stay

## 2024-07-13 ENCOUNTER — Telehealth: Admitting: Hematology and Oncology

## 2024-07-18 ENCOUNTER — Inpatient Hospital Stay: Attending: Hematology and Oncology

## 2024-07-18 DIAGNOSIS — I825Y2 Chronic embolism and thrombosis of unspecified deep veins of left proximal lower extremity: Secondary | ICD-10-CM

## 2024-07-18 LAB — D-DIMER, QUANTITATIVE: D-Dimer, Quant: 0.74 ug{FEU}/mL — ABNORMAL HIGH (ref 0.00–0.50)

## 2024-07-20 ENCOUNTER — Ambulatory Visit (HOSPITAL_COMMUNITY)
Admission: RE | Admit: 2024-07-20 | Discharge: 2024-07-20 | Disposition: A | Discharge status: Home / Self Care | Source: Ambulatory Visit | Attending: Hematology and Oncology | Admitting: Hematology and Oncology

## 2024-07-20 ENCOUNTER — Other Ambulatory Visit: Payer: Self-pay | Admitting: *Deleted

## 2024-07-20 DIAGNOSIS — I825Y2 Chronic embolism and thrombosis of unspecified deep veins of left proximal lower extremity: Secondary | ICD-10-CM | POA: Diagnosis not present

## 2024-07-25 ENCOUNTER — Inpatient Hospital Stay: Admitting: Hematology and Oncology

## 2024-07-25 DIAGNOSIS — Z7901 Long term (current) use of anticoagulants: Secondary | ICD-10-CM | POA: Diagnosis not present

## 2024-07-25 DIAGNOSIS — I825Y2 Chronic embolism and thrombosis of unspecified deep veins of left proximal lower extremity: Secondary | ICD-10-CM

## 2024-07-25 MED ORDER — APIXABAN 5 MG PO TABS: 5.0000 mg | ORAL_TABLET | Freq: Two times a day (BID) | ORAL | 3 refills | Status: AC

## 2024-07-25 NOTE — Assessment & Plan Note (Signed)
 Unprovoked left leg DVT January 2025. Repeat ultrasound August 2025: Chronic DVT   Recommendation: Hypercoagulability workup 03/09/2024: No lupus anticoagulant, factor V Leiden negative, prothrombin gene mutation negative, protein C 81%, protein S 108%, Antithrombin III  96%, anticardiolipin antibodies: Negative, beta-2  glycoprotein 1 antibodies: Negative ultrasound of the left leg 07/20/2024: Age indeterminant DVT left popliteal vein Conditions for stopping anticoagulation: Complete resolution of DVT, and a negative D-dimer.   Since there is still persistence of blood clot I do not recommend discontinuation of anticoagulation therapy at this time.  Recheck with another ultrasound in 6 months and telephone follow-up after that Follow-up after the ultrasound and labs in January 2026

## 2024-07-25 NOTE — Progress Notes (Signed)
 HEMATOLOGY-ONCOLOGY TELEPHONE VISIT PROGRESS NOTE  I connected with our patient on 07/25/24 at  8:00 AM EST by telephone and verified that I am speaking with the correct person using two identifiers.  I discussed the limitations, risks, security and privacy concerns of performing an evaluation and management service by telephone and the availability of in person appointments.  I also discussed with the patient that there may be a patient responsible charge related to this service. The patient expressed understanding and agreed to proceed.   History of Present Illness: Follow-up of history of left leg DVT  History of Present Illness Jonathan Perkins is a 74 year old male with chronic left proximal lower extremity deep vein thrombosis who presents for hematology follow-up regarding residual thrombus.  A left proximal lower extremity DVT was diagnosed one year ago. A recent ultrasound shows persistent residual thrombus in the left leg without significant interval change. He is asymptomatic without new pain, swelling, or other limb symptoms.  He continues Eliquis  anticoagulation and Envarsus for immunosuppression. He sometimes confuses the names of these medications but does not mix them up when taking them. He has no new bleeding, thrombotic symptoms, or other complications related to anticoagulation or immunosuppression.    REVIEW OF SYSTEMS:   Constitutional: Denies fevers, chills or abnormal weight loss All other systems were reviewed with the patient and are negative. Observations/Objective:     Assessment Plan:  Left leg DVT (HCC) Unprovoked left leg DVT January 2025. Repeat ultrasound August 2025: Chronic DVT   Recommendation: Hypercoagulability workup 03/09/2024: No lupus anticoagulant, factor V Leiden negative, prothrombin gene mutation negative, protein C 81%, protein S 108%, Antithrombin III  96%, anticardiolipin antibodies: Negative, beta-2  glycoprotein 1 antibodies:  Negative ultrasound of the left leg 07/20/2024: Age indeterminant DVT left popliteal vein Conditions for stopping anticoagulation: Complete resolution of DVT, and a negative D-dimer.   Since there is still persistence of blood clot I do not recommend discontinuation of anticoagulation therapy at this time.  Recheck with another ultrasound in 6 months and telephone follow-up after that Follow-up after the ultrasound and labs in January 2026     I discussed the assessment and treatment plan with the patient. The patient was provided an opportunity to ask questions and all were answered. The patient agreed with the plan and demonstrated an understanding of the instructions. The patient was advised to call back or seek an in-person evaluation if the symptoms worsen or if the condition fails to improve as anticipated.   I provided 20 minutes of non-face-to-face time during this encounter.  This includes time for charting and coordination of care   Naomi MARLA Chad, MD
